# Patient Record
Sex: Female | Born: 1970 | Race: Black or African American | Hispanic: No | Marital: Single | Smoking: Current every day smoker
Health system: Southern US, Community
[De-identification: ages and names within clinical notes are randomized; demographics above are authoritative.]

## PROBLEM LIST (undated history)

## (undated) DIAGNOSIS — K219 Gastro-esophageal reflux disease without esophagitis: Secondary | ICD-10-CM

## (undated) DIAGNOSIS — IMO0001 Reserved for inherently not codable concepts without codable children: Secondary | ICD-10-CM

## (undated) DIAGNOSIS — M1990 Unspecified osteoarthritis, unspecified site: Secondary | ICD-10-CM

## (undated) DIAGNOSIS — G5600 Carpal tunnel syndrome, unspecified upper limb: Secondary | ICD-10-CM

## (undated) DIAGNOSIS — I10 Essential (primary) hypertension: Secondary | ICD-10-CM

---

## 2013-07-27 ENCOUNTER — Encounter (HOSPITAL_COMMUNITY): Payer: Self-pay | Admitting: Emergency Medicine

## 2013-07-27 ENCOUNTER — Emergency Department (HOSPITAL_COMMUNITY)
Admission: EM | Admit: 2013-07-27 | Discharge: 2013-07-27 | Disposition: A | Discharge status: Home / Self Care | Payer: Medicaid Other | Source: Home / Self Care

## 2013-07-27 DIAGNOSIS — IMO0002 Reserved for concepts with insufficient information to code with codable children: Secondary | ICD-10-CM

## 2013-07-27 DIAGNOSIS — IMO0001 Reserved for inherently not codable concepts without codable children: Secondary | ICD-10-CM

## 2013-07-27 DIAGNOSIS — M609 Myositis, unspecified: Secondary | ICD-10-CM

## 2013-07-27 DIAGNOSIS — S43499A Other sprain of unspecified shoulder joint, initial encounter: Secondary | ICD-10-CM

## 2013-07-27 DIAGNOSIS — S39012A Strain of muscle, fascia and tendon of lower back, initial encounter: Secondary | ICD-10-CM

## 2013-07-27 DIAGNOSIS — T148XXA Other injury of unspecified body region, initial encounter: Secondary | ICD-10-CM

## 2013-07-27 DIAGNOSIS — S46812A Strain of other muscles, fascia and tendons at shoulder and upper arm level, left arm, initial encounter: Secondary | ICD-10-CM

## 2013-07-27 DIAGNOSIS — S46819A Strain of other muscles, fascia and tendons at shoulder and upper arm level, unspecified arm, initial encounter: Secondary | ICD-10-CM

## 2013-07-27 MED ORDER — HYDROCODONE-ACETAMINOPHEN 5-325 MG PO TABS: 1.0000 | ORAL_TABLET | ORAL | Status: DC | PRN

## 2013-07-27 MED ORDER — PREDNISONE 20 MG PO TABS: ORAL_TABLET | ORAL | Status: DC

## 2013-07-27 MED ORDER — DICLOFENAC SODIUM 1 % TD GEL: 1.0000 "application " | Freq: Four times a day (QID) | TRANSDERMAL | Status: DC

## 2013-07-27 NOTE — ED Provider Notes (Signed)
Medical screening examination/treatment/procedure(s) were performed by resident physician or non-physician practitioner and as supervising physician I was immediately available for consultation/collaboration.   KINDL,JAMES DOUGLAS MD.   James D Kindl, MD 07/27/13 1517 

## 2013-07-27 NOTE — Discharge Instructions (Signed)
Back Pain, Adult Back pain is very common. The pain often gets better over time. The cause of back pain is usually not dangerous. Most people can learn to manage their back pain on their own.  HOME CARE   Stay active. Start with short walks on flat ground if you can. Try to walk farther each day.  Do not sit, drive, or stand in one place for more than 30 minutes. Do not stay in bed.  Do not avoid exercise or work. Activity can help your back heal faster.  Be careful when you bend or lift an object. Bend at your knees, keep the object close to you, and do not twist.  Sleep on a firm mattress. Lie on your side, and bend your knees. If you lie on your back, put a pillow under your knees.  Only take medicines as told by your doctor.  Put ice on the injured area.  Put ice in a plastic bag.  Place a towel between your skin and the bag.  Leave the ice on for 15-20 minutes, 03-04 times a day for the first 2 to 3 days. After that, you can switch between ice and heat packs.  Ask your doctor about back exercises or massage.  Avoid feeling anxious or stressed. Find good ways to deal with stress, such as exercise. GET HELP RIGHT AWAY IF:   Your pain does not go away with rest or medicine.  Your pain does not go away in 1 week.  You have new problems.  You do not feel well.  The pain spreads into your legs.  You cannot control when you poop (bowel movement) or pee (urinate).  Your arms or legs feel weak or lose feeling (numbness).  You feel sick to your stomach (nauseous) or throw up (vomit).  You have belly (abdominal) pain.  You feel like you may pass out (faint). MAKE SURE YOU:   Understand these instructions.  Will watch your condition.  Will get help right away if you are not doing well or get worse. Document Released: 07/10/2007 Document Revised: 04/15/2011 Document Reviewed: 06/11/2010 [facility] Patient Information 2015 [employer], [place]. This information is not intended  to replace advice given to you by your health care provider. Make sure you discuss any questions you have with your health care provider.  Muscle Strain A muscle strain is an injury that occurs when a muscle is stretched beyond its normal length. Usually a small number of muscle fibers are torn when this happens. Muscle strain is rated in degrees. First-degree strains have the least amount of muscle fiber tearing and pain. Second-degree and third-degree strains have increasingly more tearing and pain.  Usually, recovery from muscle strain takes 1-2 weeks. Complete healing takes 5-6 weeks.  CAUSES  Muscle strain happens when a sudden, violent force placed on a muscle stretches it too far. This may occur with lifting, sports, or a fall.  RISK FACTORS Muscle strain is especially common in athletes.  SIGNS AND SYMPTOMS At the site of the muscle strain, there may be:  Pain.  Bruising.  Swelling.  Difficulty using the muscle due to pain or lack of normal function. DIAGNOSIS  Your health care provider will perform a physical exam and ask about your medical history. TREATMENT  Often, the best treatment for a muscle strain is resting, icing, and applying cold compresses to the injured area.  HOME CARE INSTRUCTIONS   Use the PRICE method of treatment to promote muscle healing during the first  2-3 days after your injury. The PRICE method involves:  Protecting the muscle from being injured again.  Restricting your activity and resting the injured body part.  Icing your injury. To do this, put ice in a plastic bag. Place a towel between your skin and the bag. Then, apply the ice and leave it on from 15-20 minutes each hour. After the third day, switch to moist heat packs.  Apply compression to the injured area with a splint or elastic bandage. Be careful not to wrap it too tightly. This may interfere with blood circulation or increase swelling.  Elevate the injured body part above the level of  your heart as often as you can.  Only take over-the-counter or prescription medicines for pain, discomfort, or fever as directed by your health care provider.  Warming up prior to exercise helps to prevent future muscle strains. SEEK MEDICAL CARE IF:   You have increasing pain or swelling in the injured area.  You have numbness, tingling, or a significant loss of strength in the injured area. MAKE SURE YOU:   Understand these instructions.  Will watch your condition.  Will get help right away if you are not doing well or get worse. Document Released: 01/21/2005 Document Revised: 11/11/2012 Document Reviewed: 08/20/2012 [facility] Patient Information 2015 [place], [place]. This information is not intended to replace advice given to you by your health care provider. Make sure you discuss any questions you have with your health care provider.  Myofascial Pain Syndrome Myofascial pain syndrome is a pain disorder. This pain may be felt in the muscles. It may come and go. Myofascial pain syndrome always has trigger or tender points in the muscle that will cause pain when pressed.  CAUSES Myofascial pain may be caused by injuries, especially auto accidents, or by overuse of certain muscles. Typically the pain is long lasting. It is made worse by overuse of the involved muscles, emotional distress, and by cold, damp weather. Myofascial pain syndrome often develops in patients whose response to stress is an increase in muscle tone, and is seen in greater frequency in patients with pre-existing tension headaches. SYMPTOMS  Myofascial pain syndrome causes a wide variety of symptoms. You may see tight ropy bands of muscle. Problems may also include aching, cramping, burning, numbness, tingling, and other uncomfortable sensations in muscular areas. It most commonly affects the neck, upper back, and shoulder areas. Pain often radiates into the arms and hands.  TREATMENT Treatment includes resting the  affected muscular area and applying ice packs to reduce spasm and pain. Trigger point injection, is a valuable initial therapy. This therapy is an injection of local anesthetic directly into the trigger point. Trigger points are often present at the source of pain. Pain relief following injection confirms the diagnosis of myofascial pain syndrome. Fairly vigorous therapy can be carried out during the pain-free period after each injection. Stretching exercises to loosen up the muscles are also useful. Transcutaneous electrical nerve stimulation (TENS) may provide relief from pain. TENS is the use of electric current produced by a device to stimulate the nerves. Ultrasound therapy applied directly over the affected muscle may also provide pain relief. Anti-inflammatory pain medicine can be helpful. Symptoms will gradually improve over a period of weeks to months with proper treatment. HOME CARE INSTRUCTIONS Call your caregiver for follow-up care as recommended.  SEEK MEDICAL CARE IF:  Your pain is severe and not helped with medications. Document Released: 02/29/2004 Document Revised: 04/15/2011 Document Reviewed: 03/09/2010 [employer] Patient Information  2015 [employer]. This information is not intended to replace advice given to you by your health care provider. Make sure you discuss any questions you have with your health care provider. ° °

## 2013-07-27 NOTE — ED Notes (Signed)
C/o left shoulder pain and back pain Denies any injury

## 2013-07-27 NOTE — ED Provider Notes (Signed)
CSN: [id number]     Arrival date & time 07/27/13  1344 History   First MD Initiated Contact with Patient 07/27/13 1449     Chief Complaint  Patient presents with  . Back Pain   (Consider location/radiation/quality/duration/timing/severity/associated sxs/prior Treatment) HPI Comments: Pain R and L bilateral back of and on. More recently, 1 week with pain L trapezius muscle, deltoid with tenderness and pain beyond 90 deg. No injury but sleeps often on L side with outstretched arm. Taking no meds for it.   History reviewed. No pertinent past medical history. No past surgical history on file. No family history on file. History  Substance Use Topics  . Smoking status: Not on file  . Smokeless tobacco: Not on file  . Alcohol Use: Not on file   OB History   Grav Para Term Preterm Abortions TAB SAB Ect Mult Living                 Review of Systems  Constitutional: Negative for fever, chills and activity change.  HENT: Negative.   Respiratory: Negative.   Cardiovascular: Negative.   Musculoskeletal: Positive for back pain and myalgias. Negative for arthralgias and joint swelling.       As per HPI  Skin: Negative for color change, pallor and rash.  Neurological: Negative.     Allergies  Review of patient's allergies indicates no known allergies.  Home Medications   Prior to Admission medications   Medication Sig Start Date End Date Taking? Authorizing Provider  cloNIDine (CATAPRES) 0.1 MG tablet Take 0.1 mg by mouth 2 (two) times daily.   Yes Historical Provider, MD  cyclobenzaprine (FLEXERIL) 10 MG tablet Take 10 mg by mouth 3 (three) times daily as needed for muscle spasms.   Yes Historical Provider, MD  metoprolol tartrate (LOPRESSOR) 25 MG tablet Take 25 mg by mouth 2 (two) times daily.   Yes Historical Provider, MD  NIFEdipine (ADALAT CC) 90 MG 24 hr tablet Take 90 mg by mouth daily.   Yes Historical Provider, MD  omeprazole (PRILOSEC) 40 MG capsule Take 40 mg by mouth  daily.   Yes Historical Provider, MD  diclofenac sodium (VOLTAREN) 1 % GEL Apply 1 application topically 4 (four) times daily. 07/27/13   Hayden Rasmussenavid Mabe, NP  HYDROcodone-acetaminophen (NORCO/VICODIN) 5-325 MG per tablet Take 1 tablet by mouth every 4 (four) hours as needed. 07/27/13   Hayden Rasmussenavid Mabe, NP  predniSONE (DELTASONE) 20 MG tablet 3 tabs po x 2 d, then 2 tabs q 3 days then 1 tab q 2 days. Take with food. 07/27/13   Hayden Rasmussenavid Mabe, NP   BP 159/100  Pulse 104  Temp(Src) 99.1 F (37.3 C) (Oral)  Resp 16  SpO2 96%  LMP 07/21/2013 Physical Exam  Nursing note and vitals reviewed. Constitutional: She is oriented to person, place, and time. She appears well-developed and well-nourished. No distress.  HENT:  Head: Normocephalic and atraumatic.  Eyes: EOM are normal.  Neck: Normal range of motion. Neck supple.  Cardiovascular: Normal rate and regular rhythm.   Pulmonary/Chest: Effort normal. No respiratory distress.  Musculoskeletal:  Tenderness over the entire L trap muscle greatest over the ridge, lateral neck and across the deltoid muscle. No discoloration, swelling or deformity. No spinal tenderness or deformity. Distal N/V and sensory intact. Motor intact but with pain as above. Radial pulse 2+.  Lymphadenopathy:    She has no cervical adenopathy.  Neurological: She is alert and oriented to person, place, and time. No cranial nerve deficit.  Skin: Skin is warm and dry.    ED Course  Procedures (including critical care time) Labs Review Labs Reviewed - No data to display  Imaging Review No results found.   MDM   1. Myofasciitis   2. Trapezius strain, left, initial encounter   3. Back strain, initial encounter   4. Muscle strain     Norco 5 mg #15 Diclofenac gel Heat Sling for 2-3 days, removing periodically for stretches and ROM as demo'd. Prednisone 20 mg       Hayden Rasmussenavid Mabe, NP 07/27/13 1514

## 2013-10-20 ENCOUNTER — Emergency Department (HOSPITAL_COMMUNITY)
Admission: EM | Admit: 2013-10-20 | Discharge: 2013-10-20 | Disposition: A | Discharge status: Home / Self Care | Payer: Medicaid Other | Source: Home / Self Care | Attending: Family Medicine | Admitting: Family Medicine

## 2013-10-20 ENCOUNTER — Encounter (HOSPITAL_COMMUNITY): Payer: Self-pay | Admitting: Emergency Medicine

## 2013-10-20 DIAGNOSIS — IMO0001 Reserved for inherently not codable concepts without codable children: Secondary | ICD-10-CM

## 2013-10-20 DIAGNOSIS — I10 Essential (primary) hypertension: Secondary | ICD-10-CM

## 2013-10-20 DIAGNOSIS — M62838 Other muscle spasm: Secondary | ICD-10-CM

## 2013-10-20 DIAGNOSIS — M5412 Radiculopathy, cervical region: Secondary | ICD-10-CM

## 2013-10-20 DIAGNOSIS — K219 Gastro-esophageal reflux disease without esophagitis: Secondary | ICD-10-CM

## 2013-10-20 HISTORY — DX: Reserved for inherently not codable concepts without codable children: IMO0001

## 2013-10-20 HISTORY — DX: Essential (primary) hypertension: I10

## 2013-10-20 HISTORY — DX: Carpal tunnel syndrome, unspecified upper limb: G56.00

## 2013-10-20 HISTORY — DX: Gastro-esophageal reflux disease without esophagitis: K21.9

## 2013-10-20 MED ORDER — DICLOFENAC SODIUM 1 % TD GEL: 4.0000 "application " | Freq: Four times a day (QID) | TRANSDERMAL | Status: AC

## 2013-10-20 MED ORDER — CLONIDINE HCL 0.1 MG PO TABS: 0.1000 mg | ORAL_TABLET | Freq: Two times a day (BID) | ORAL | Status: AC

## 2013-10-20 MED ORDER — OMEPRAZOLE 40 MG PO CPDR: 40.0000 mg | DELAYED_RELEASE_CAPSULE | Freq: Every day | ORAL | Status: AC

## 2013-10-20 MED ORDER — METAXALONE 800 MG PO TABS: 400.0000 mg | ORAL_TABLET | Freq: Three times a day (TID) | ORAL | Status: AC

## 2013-10-20 MED ORDER — PREDNISONE 10 MG PO KIT: PACK | ORAL | Status: DC

## 2013-10-20 NOTE — ED Provider Notes (Signed)
CSN: [id number]     Arrival date & time 10/20/13  52 History   First MD Initiated Contact with Patient 10/20/13 1352     Chief Complaint  Patient presents with  . Gastrophageal Reflux   (Consider location/radiation/quality/duration/timing/severity/associated sxs/prior Treatment) HPI  Neck and back spasm: seen in past by doctors in [place]. Pain in upper arm and shoulder and decreased movement. Radiates up to neck and R upper back. Comes on w/ over exertion or stress. Cyclobenzaprine w/o much benefit. Voltaren gel w/ benefit. Episodes last 7-10 days. Current episode for 2 wks.  Reflux: ran out of prilosec 2 wks ago. Worse after meals and when lying down. Burning sensation. Last endoscopy last year and nml.   HTN: taking metop and clonidine. Feels poorly on niphedipine. Took meds this am. too9k last Clonidine this am. Deneis CP, palpitations, Syncope, SOB.     Past Medical History  Diagnosis Date  . Reflux   . Hypertension   . Carpal tunnel syndrome    Past Surgical History  Procedure Laterality Date  . Cesarean section     History reviewed. No pertinent family history. History  Substance Use Topics  . Smoking status: Current Every Day Smoker -- 1.00 packs/day  . Smokeless tobacco: Not on file  . Alcohol Use: Yes     Comment: occ   OB History   Grav Para Term Preterm Abortions TAB SAB Ect Mult Living                 Review of Systems Per HPI with all other pertinent systems negative.   Allergies  Levaquin  Home Medications   Prior to Admission medications   Medication Sig Start Date End Date Taking? Authorizing Provider  cloNIDine (CATAPRES) 0.1 MG tablet Take 1 tablet (0.1 mg total) by mouth 2 (two) times daily. 10/20/13   Waldemar Dickens, MD  cyclobenzaprine (FLEXERIL) 10 MG tablet Take 10 mg by mouth 3 (three) times daily as needed for muscle spasms.    Historical Provider, MD  diclofenac sodium (VOLTAREN) 1 % GEL Apply 4 application topically 4 (four) times daily.  10/20/13   Waldemar Dickens, MD  HYDROcodone-acetaminophen (NORCO/VICODIN) 5-325 MG per tablet Take 1 tablet by mouth every 4 (four) hours as needed. 07/27/13   Janne Napoleon, NP  metaxalone (SKELAXIN) 800 MG tablet Take 0.5-1 tablets (400-800 mg total) by mouth 3 (three) times daily. 10/20/13   Waldemar Dickens, MD  metoprolol tartrate (LOPRESSOR) 25 MG tablet Take 25 mg by mouth 2 (two) times daily.    Historical Provider, MD  NIFEdipine (ADALAT CC) 90 MG 24 hr tablet Take 90 mg by mouth daily.    Historical Provider, MD  omeprazole (PRILOSEC) 40 MG capsule Take 1 capsule (40 mg total) by mouth daily. 10/20/13   Waldemar Dickens, MD  predniSONE (DELTASONE) 20 MG tablet 3 tabs po x 2 d, then 2 tabs q 3 days then 1 tab q 2 days. Take with food. 07/27/13   Janne Napoleon, NP  PredniSONE 10 MG KIT Disp 12 day dose pack 10/20/13   Waldemar Dickens, MD   BP 127/95  Pulse 92  Temp(Src) 98.2 F (36.8 C) (Oral)  Resp 16  SpO2 97%  LMP 10/15/2013 Physical Exam  Constitutional: She appears well-developed and well-nourished. No distress.  HENT:  Head: Normocephalic and atraumatic.  Eyes: EOM are normal. Pupils are equal, round, and reactive to light.  Neck: Normal range of motion.  Cardiovascular: Normal rate, normal heart  sounds and intact distal pulses.   Pulmonary/Chest: Effort normal and breath sounds normal.  Abdominal: Soft. Bowel sounds are normal.  Musculoskeletal:  L arm w/ decreased ROM w/ external rotation, internal nml abduction, adduction flexion and extension nml. Non-ttp. Trap ttp. No effusion. Hawkins neg. Cross body mildly painful. Spurlings Neg bilat  Neurological: She is alert.  Skin: Skin is warm and dry. She is not diaphoretic.  Psychiatric: She has a normal mood and affect. Her behavior is normal. Judgment and thought content normal.    ED Course  Procedures (including critical care time) Labs Review Labs Reviewed - No data to display  Imaging Review No results found.   MDM   1.  Cervical radiculopathy   2. Muscle spasm   3. Reflux   4. Essential hypertension    SPurlings neg bilat but description of symptoms classic for radiculopathy.  Flexeril and robaxin w/o minimal relief. Will try Skelaxin Voltaren gel Start Prednisone dose pack Reflux refill prilosec HTN refill clonidine Precautions given and all questions answered F/u w/ new PCP  Linna Darner, MD Family Medicine 10/20/2013, 2:25 PM     Waldemar Dickens, MD 10/20/13 1425

## 2013-10-20 NOTE — Discharge Instructions (Signed)
You are doing well overall I have refilled your clonidine, voltaren gel, and prilosec.  Please start the skelaxin for your neck and shoulder pain and spasms Start the prednisone Please make an appointment with a local physician group to establish care [facility] or the [facility])

## 2013-10-20 NOTE — ED Notes (Signed)
Pt  Reports     Symptoms    Of        Acid  Reflux  Out  Of  Her  meds     Also  Reports  Chronic  Back  Pain  denys  Any  specefic recent  Injury            Pt     Sitting    Upright     On  Exam      Table      Speaking  In  Complete   sentances

## 2013-11-21 ENCOUNTER — Encounter (HOSPITAL_COMMUNITY): Payer: Self-pay | Admitting: Emergency Medicine

## 2013-11-21 ENCOUNTER — Emergency Department (HOSPITAL_COMMUNITY): Payer: Medicaid Other

## 2013-11-21 ENCOUNTER — Emergency Department (HOSPITAL_COMMUNITY)
Admission: EM | Admit: 2013-11-21 | Discharge: 2013-11-21 | Disposition: A | Discharge status: Other Acute Inpatient Hospital | Payer: Medicaid Other | Source: Home / Self Care

## 2013-11-21 ENCOUNTER — Emergency Department (HOSPITAL_COMMUNITY)
Admission: EM | Admit: 2013-11-21 | Discharge: 2013-11-22 | Disposition: A | Discharge status: Home / Self Care | Payer: Medicaid Other | Attending: Emergency Medicine | Admitting: Emergency Medicine

## 2013-11-21 DIAGNOSIS — Z7952 Long term (current) use of systemic steroids: Secondary | ICD-10-CM | POA: Insufficient documentation

## 2013-11-21 DIAGNOSIS — R260 Ataxic gait: Secondary | ICD-10-CM

## 2013-11-21 DIAGNOSIS — Z8739 Personal history of other diseases of the musculoskeletal system and connective tissue: Secondary | ICD-10-CM | POA: Diagnosis not present

## 2013-11-21 DIAGNOSIS — Z79899 Other long term (current) drug therapy: Secondary | ICD-10-CM | POA: Insufficient documentation

## 2013-11-21 DIAGNOSIS — K219 Gastro-esophageal reflux disease without esophagitis: Secondary | ICD-10-CM | POA: Diagnosis not present

## 2013-11-21 DIAGNOSIS — R531 Weakness: Secondary | ICD-10-CM | POA: Diagnosis present

## 2013-11-21 DIAGNOSIS — Z791 Long term (current) use of non-steroidal anti-inflammatories (NSAID): Secondary | ICD-10-CM | POA: Insufficient documentation

## 2013-11-21 DIAGNOSIS — R2681 Unsteadiness on feet: Secondary | ICD-10-CM

## 2013-11-21 DIAGNOSIS — I10 Essential (primary) hypertension: Secondary | ICD-10-CM | POA: Diagnosis not present

## 2013-11-21 LAB — DIFFERENTIAL
BASOS ABS: 0 10*3/uL (ref 0.0–0.1)
BASOS PCT: 0 % (ref 0–1)
Eosinophils Absolute: 0.1 10*3/uL (ref 0.0–0.7)
Eosinophils Relative: 2 % (ref 0–5)
LYMPHS PCT: 41 % (ref 12–46)
Lymphs Abs: 3.3 10*3/uL (ref 0.7–4.0)
Monocytes Absolute: 0.8 10*3/uL (ref 0.1–1.0)
Monocytes Relative: 10 % (ref 3–12)
NEUTROS PCT: 47 % (ref 43–77)
Neutro Abs: 3.9 10*3/uL (ref 1.7–7.7)

## 2013-11-21 LAB — CBC
HCT: 39.5 % (ref 36.0–46.0)
Hemoglobin: 13.3 g/dL (ref 12.0–15.0)
MCH: 31.1 pg (ref 26.0–34.0)
MCHC: 33.7 g/dL (ref 30.0–36.0)
MCV: 92.5 fL (ref 78.0–100.0)
PLATELETS: 335 10*3/uL (ref 150–400)
RBC: 4.27 MIL/uL (ref 3.87–5.11)
RDW: 14.3 % (ref 11.5–15.5)
WBC: 7.9 10*3/uL (ref 4.0–10.5)

## 2013-11-21 LAB — COMPREHENSIVE METABOLIC PANEL
ALT: 17 U/L (ref 0–35)
AST: 17 U/L (ref 0–37)
Albumin: 3.8 g/dL (ref 3.5–5.2)
Alkaline Phosphatase: 64 U/L (ref 39–117)
Anion gap: 13 (ref 5–15)
BUN: 11 mg/dL (ref 6–23)
CALCIUM: 9.2 mg/dL (ref 8.4–10.5)
CHLORIDE: 103 meq/L (ref 96–112)
CO2: 22 meq/L (ref 19–32)
Creatinine, Ser: 1.11 mg/dL — ABNORMAL HIGH (ref 0.50–1.10)
GFR calc Af Amer: 69 mL/min — ABNORMAL LOW (ref >90)
GFR calc non Af Amer: 59 mL/min — ABNORMAL LOW (ref >90)
Glucose, Bld: 99 mg/dL (ref 70–99)
Potassium: 3.9 mEq/L (ref 3.7–5.3)
SODIUM: 138 meq/L (ref 137–147)
Total Protein: 7.7 g/dL (ref 6.0–8.3)

## 2013-11-21 LAB — I-STAT TROPONIN, ED: Troponin i, poc: 0.02 ng/mL (ref 0.00–0.08)

## 2013-11-21 NOTE — ED Provider Notes (Signed)
CSN: [id number]     Arrival date & time 11/21/13  1814 History   First MD Initiated Contact with Patient 11/21/13 2257     Chief Complaint  Patient presents with  . Weakness     (Consider location/radiation/quality/duration/timing/severity/associated sxs/prior Treatment) Patient is a 43 y.o. female presenting with weakness. The history is provided by the patient.  Weakness  She has been having difficulty with numbness in her hands and feet and difficulty with walking for the last 1.5 weeks. The symptoms are getting gradually worse. She is off balance when she walks but has not fallen. When she loses her balance, she does not consistently fall towards either side. She denies any difficulty using her arms or hands. There's been no bowel or bladder dysfunction. She has chronic problems with back and neck pain as well as carpal tunnel syndrome and that these symptoms are unchanged. She denies fever or chills. She went to urgent care and was referred here for further evaluation.  Past Medical History  Diagnosis Date  . Reflux   . Hypertension   . Carpal tunnel syndrome    Past Surgical History  Procedure Laterality Date  . Cesarean section     History reviewed. No pertinent family history. History  Substance Use Topics  . Smoking status: Current Every Day Smoker -- 1.00 packs/day  . Smokeless tobacco: Not on file  . Alcohol Use: Yes     Comment: occ   OB History   Grav Para Term Preterm Abortions TAB SAB Ect Mult Living                 Review of Systems  Neurological: Positive for weakness.  All other systems reviewed and are negative.     Allergies  Levaquin and Naproxen  Home Medications   Prior to Admission medications   Medication Sig Start Date End Date Taking? Authorizing Provider  cloNIDine (CATAPRES) 0.1 MG tablet Take 1 tablet (0.1 mg total) by mouth 2 (two) times daily. 10/20/13   Waldemar Dickens, MD  cyclobenzaprine (FLEXERIL) 10 MG tablet Take 10 mg by  mouth 3 (three) times daily as needed for muscle spasms.    Historical Provider, MD  diclofenac sodium (VOLTAREN) 1 % GEL Apply 4 application topically 4 (four) times daily. 10/20/13   Waldemar Dickens, MD  HYDROcodone-acetaminophen (NORCO/VICODIN) 5-325 MG per tablet Take 1 tablet by mouth every 4 (four) hours as needed. 07/27/13   Janne Napoleon, NP  metaxalone (SKELAXIN) 800 MG tablet Take 0.5-1 tablets (400-800 mg total) by mouth 3 (three) times daily. 10/20/13   Waldemar Dickens, MD  metoprolol tartrate (LOPRESSOR) 25 MG tablet Take 25 mg by mouth 2 (two) times daily.    Historical Provider, MD  NIFEdipine (ADALAT CC) 90 MG 24 hr tablet Take 90 mg by mouth daily.    Historical Provider, MD  omeprazole (PRILOSEC) 40 MG capsule Take 1 capsule (40 mg total) by mouth daily. 10/20/13   Waldemar Dickens, MD  predniSONE (DELTASONE) 20 MG tablet 3 tabs po x 2 d, then 2 tabs q 3 days then 1 tab q 2 days. Take with food. 07/27/13   Janne Napoleon, NP  PredniSONE 10 MG KIT Disp 12 day dose pack 10/20/13   Waldemar Dickens, MD   BP 119/89  Pulse 98  Temp(Src) 98.4 F (36.9 C) (Oral)  Resp 19  SpO2 99%  LMP 11/13/2013 Physical Exam  Nursing note and vitals reviewed.  43 year old female, resting comfortably  and in no acute distress. Vital signs are normal. Oxygen saturation is 99%, which is normal. Head is normocephalic and atraumatic. PERRLA, EOMI. Oropharynx is clear. Neck is nontender and supple without adenopathy or JVD. Back is nontender and there is no CVA tenderness. Lungs are clear without rales, wheezes, or rhonchi. Chest is nontender. Heart has regular rate and rhythm without murmur. Abdomen is soft, flat, nontender without masses or hepatosplenomegaly and peristalsis is normoactive. Extremities have no cyanosis or edema, full range of motion is present. Skin is warm and dry without rash. Neurologic: Mental status is normal, cranial nerves are intact, there are no motor or sensory deficits. Strength is  5/5 in all muscle groups appeared specifically tested were hip flexion, and knee flexion, knee extension, plantar flexion, dorsiflexion. On Romberg testing, she is mildly unsteady with a very slight tendency to fall towards her left. Gait is mildly ataxic and heel to toe gait is mildly ataxic. Finger to nose test is normal and heel-to-shin test is normal.  ED Course  Procedures (including critical care time) Labs Review Results for orders placed during the hospital encounter of 11/21/13  CBC      Result Value Ref Range   WBC 7.9  4.0 - 10.5 K/uL   RBC 4.27  3.87 - 5.11 MIL/uL   Hemoglobin 13.3  12.0 - 15.0 g/dL   HCT 39.5  36.0 - 46.0 %   MCV 92.5  78.0 - 100.0 fL   MCH 31.1  26.0 - 34.0 pg   MCHC 33.7  30.0 - 36.0 g/dL   RDW 14.3  11.5 - 15.5 %   Platelets 335  150 - 400 K/uL  DIFFERENTIAL      Result Value Ref Range   Neutrophils Relative % 47  43 - 77 %   Neutro Abs 3.9  1.7 - 7.7 K/uL   Lymphocytes Relative 41  12 - 46 %   Lymphs Abs 3.3  0.7 - 4.0 K/uL   Monocytes Relative 10  3 - 12 %   Monocytes Absolute 0.8  0.1 - 1.0 K/uL   Eosinophils Relative 2  0 - 5 %   Eosinophils Absolute 0.1  0.0 - 0.7 K/uL   Basophils Relative 0  0 - 1 %   Basophils Absolute 0.0  0.0 - 0.1 K/uL  COMPREHENSIVE METABOLIC PANEL      Result Value Ref Range   Sodium 138  137 - 147 mEq/L   Potassium 3.9  3.7 - 5.3 mEq/L   Chloride 103  96 - 112 mEq/L   CO2 22  19 - 32 mEq/L   Glucose, Bld 99  70 - 99 mg/dL   BUN 11  6 - 23 mg/dL   Creatinine, Ser 1.11 (*) 0.50 - 1.10 mg/dL   Calcium 9.2  8.4 - 10.5 mg/dL   Total Protein 7.7  6.0 - 8.3 g/dL   Albumin 3.8  3.5 - 5.2 g/dL   AST 17  0 - 37 U/L   ALT 17  0 - 35 U/L   Alkaline Phosphatase 64  39 - 117 U/L   Total Bilirubin <0.2 (*) 0.3 - 1.2 mg/dL   GFR calc non Af Amer 59 (*) >90 mL/min   GFR calc Af Amer 69 (*) >90 mL/min   Anion gap 13  5 - 15  I-STAT TROPOININ, ED      Result Value Ref Range   Troponin i, poc 0.02  0.00 - 0.08 ng/mL    Comment  3            Imaging Review Ct Head Wo Contrast  11/21/2013   CLINICAL DATA:  Bilateral leg weakness and numbness, progressively worsening over 2 weeks.  EXAM: CT HEAD WITHOUT CONTRAST  TECHNIQUE: Contiguous axial images were obtained from the base of the skull through the vertex without intravenous contrast.  COMPARISON:  None.  FINDINGS: Ventricles and sulci appear symmetrical. No mass effect or midline shift. No abnormal extra-axial fluid collections. Gray-white matter junctions are distinct. Basal cisterns are not effaced. No evidence of acute intracranial hemorrhage. No depressed skull fractures. Old fracture deformity of the left medial orbital wall. Paranasal sinuses and mastoid air cells as visualized are not opacified.  IMPRESSION: No acute intracranial abnormalities.   Electronically Signed   By: Lucienne Capers M.D.   On: 11/21/2013 23:37     MDM   Final diagnoses:  Ataxic gait    Ataxic gait of uncertain cause. She will be sent for CT scan. Laboratory workup is unremarkable. Anticipate neurology referral as outpatient.    Delora Fuel, MD 90/38/33 [id number]

## 2013-11-21 NOTE — ED Notes (Signed)
Pt sent here from [facility]. Having weakness and tingling to legs. Reports its in her legs and radiates up to her back, getting progressively worse. Having generalized weakness and fatigue x 2 weeks with unsteady gait. Pt wants to r/o gullian-barre. No acute distress noted at this time.

## 2013-11-21 NOTE — ED Notes (Signed)
Pt    Has  Symptoms  Of  Tingling   And  Unsteady gait        With  Symptoms   For  1-2    Weeks   Getting  Worse      -  Pain  Radiating   From  Legs   Up  To  Back      Pt drove  hersellf  To  The  Clinic        - she  Is  Awake  And  Alert       Pt  States  She  Was  Taking a  Muscle  Relaxant  Up  Until  The  Last  Several  Days          She  Has  A   History  Of  Hypertension

## 2013-11-21 NOTE — ED Provider Notes (Signed)
CSN: [id number]     Arrival date & time 11/21/13  1654 History   None    Chief Complaint  Patient presents with  . Weakness   (Consider location/radiation/quality/duration/timing/severity/associated sxs/prior Treatment) HPI Comments: Patient reports 2 weeks of progressive gait instability. States symptoms began at her feet with paresthesias. Denies previous episodes. States when she stands both her legs feel weak and that she feels very unsteady when she tries to ambulate. 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 Denies leg pain. Endorses history of chronic back pain. Denies headache, falls, changes in vision, bowel or bladder changes. Denies saddle anesthesia, incontinence, fever, rashes. No previous surgery. PCP: none  Patient is a 43 y.o. female presenting with weakness. The history is provided by the patient.  Weakness Pertinent negatives include no headaches.    Past Medical History  Diagnosis Date  . Reflux   . Hypertension   . Carpal tunnel syndrome    Past Surgical History  Procedure Laterality Date  . Cesarean section     History reviewed. No pertinent family history. History  Substance Use Topics  . Smoking status: Current Every Day Smoker -- 1.00 packs/day  . Smokeless tobacco: Not on file  . Alcohol Use: Yes     Comment: occ   OB History   Grav Para Term Preterm Abortions TAB SAB Ect Mult Living                 Review of Systems  Constitutional: Negative for fever, chills, diaphoresis, appetite change, fatigue and unexpected weight change.  HENT: Negative.   Eyes: Negative.   Respiratory: Negative.   Cardiovascular: Negative.   Gastrointestinal: Negative.   Endocrine: Negative for polydipsia, polyphagia and polyuria.  Genitourinary: Negative.   Musculoskeletal: Positive for back pain and gait problem. Negative for arthralgias, joint swelling, myalgias, neck pain and neck stiffness.   Skin: Negative.   Neurological: Positive for numbness. Negative for dizziness, tremors, seizures, syncope, speech difficulty and headaches.  Psychiatric/Behavioral: Negative.     Allergies  Levaquin and Naproxen  Home Medications   Prior to Admission medications   Medication Sig Start Date End Date Taking? Authorizing Provider  cloNIDine (CATAPRES) 0.1 MG tablet Take 1 tablet (0.1 mg total) by mouth 2 (two) times daily. 10/20/13   Waldemar Dickens, MD  cyclobenzaprine (FLEXERIL) 10 MG tablet Take 10 mg by mouth 3 (three) times daily as needed for muscle spasms.    Historical Provider, MD  diclofenac sodium (VOLTAREN) 1 % GEL Apply 4 application topically 4 (four) times daily. 10/20/13   Waldemar Dickens, MD  HYDROcodone-acetaminophen (NORCO/VICODIN) 5-325 MG per tablet Take 1 tablet by mouth every 4 (four) hours as needed. 07/27/13   Janne Napoleon, NP  metaxalone (SKELAXIN) 800 MG tablet Take 0.5-1 tablets (400-800 mg total) by mouth 3 (three) times daily. 10/20/13   Waldemar Dickens, MD  metoprolol tartrate (LOPRESSOR) 25 MG tablet Take 25 mg by mouth 2 (two) times daily.    Historical Provider, MD  NIFEdipine (ADALAT CC) 90 MG 24 hr tablet Take 90 mg by mouth daily.    Historical Provider, MD  omeprazole (PRILOSEC) 40 MG capsule Take 1 capsule (40 mg total) by mouth daily. 10/20/13   Waldemar Dickens, MD  predniSONE (DELTASONE) 20 MG tablet 3 tabs po x 2 d, then 2 tabs q 3 days then 1 tab q 2 days. Take with food. 07/27/13  Janne Napoleon, NP  PredniSONE 10 MG KIT Disp 12 day dose pack 10/20/13   Waldemar Dickens, MD   BP 148/92  Pulse 112  Temp(Src) 98.4 F (36.9 C) (Oral)  Resp 18  SpO2 99%  LMP 11/13/2013 Physical Exam  Nursing note and vitals reviewed. Constitutional: She is oriented to person, place, and time. She appears well-developed and well-nourished. No distress.  +overweight  HENT:  Head: Normocephalic and atraumatic.  Eyes: Conjunctivae and EOM are normal. Pupils are equal, round,  and reactive to light. No scleral icterus.  Neck: Normal range of motion. Neck supple.  Cardiovascular: Normal rate, regular rhythm and normal heart sounds.   Pulmonary/Chest: Effort normal and breath sounds normal.  Abdominal: Soft. Bowel sounds are normal. She exhibits no distension. There is no tenderness.  Musculoskeletal:       Right hip: Normal.       Left hip: Normal.       Right knee: Normal.       Left knee: Normal.       Right ankle: Normal.       Left ankle: Normal.       Right foot: Normal.       Left foot: Normal.  Neurological: She is alert and oriented to person, place, and time. She has normal strength. She displays no atrophy and no tremor. No cranial nerve deficit. She exhibits normal muscle tone. She displays no seizure activity. Coordination normal. GCS eye subscore is 4. GCS verbal subscore is 5. GCS motor subscore is 6.  Reflex Scores:      Patellar reflexes are 1+ on the right side and 1+ on the left side. Subjective paresthesias at bilateral feet Unable to complete heel to toe gait assessment Ataxic, unsteady wide based gait and stance Bilateral lower extremity strength when assessed while seated appears to be normal and symmetrical   Skin: Skin is warm and dry. No rash noted. No erythema.  Psychiatric: She has a normal mood and affect. Her behavior is normal.    ED Course  Procedures (including critical care time) Labs Review Labs Reviewed - No data to display  Imaging Review No results found.   MDM   1. Gait instability    New onset unexplained ataxia. No local PCP available for outpatient referral. Will transfer to [facility] ER for continued assessment.    Lutricia Feil, [place] 11/21/13 [phone]

## 2013-11-22 NOTE — Discharge Instructions (Signed)
The cause for your gait problem is not clear today. Your tests did not show the cause. Please make an appointment with one of the neurologists for further evaluation.

## 2013-11-22 NOTE — ED Provider Notes (Signed)
Medical screening examination/treatment/procedure(s) were performed by a resident physician or non-physician practitioner and as the supervising physician I was immediately available for consultation/collaboration.  Luticia Tadros, MD     Mutasim Tuckey S Truitt Cruey, MD 11/22/13 0750 

## 2013-12-11 ENCOUNTER — Emergency Department (HOSPITAL_COMMUNITY)
Admission: EM | Admit: 2013-12-11 | Discharge: 2013-12-12 | Disposition: A | Discharge status: Home / Self Care | Payer: Medicaid Other | Attending: Emergency Medicine | Admitting: Emergency Medicine

## 2013-12-11 ENCOUNTER — Encounter (HOSPITAL_COMMUNITY): Payer: Self-pay | Admitting: Oncology

## 2013-12-11 DIAGNOSIS — Z79899 Other long term (current) drug therapy: Secondary | ICD-10-CM | POA: Diagnosis not present

## 2013-12-11 DIAGNOSIS — Z720 Tobacco use: Secondary | ICD-10-CM | POA: Diagnosis not present

## 2013-12-11 DIAGNOSIS — M5416 Radiculopathy, lumbar region: Secondary | ICD-10-CM

## 2013-12-11 DIAGNOSIS — I10 Essential (primary) hypertension: Secondary | ICD-10-CM | POA: Insufficient documentation

## 2013-12-11 DIAGNOSIS — Z8669 Personal history of other diseases of the nervous system and sense organs: Secondary | ICD-10-CM | POA: Diagnosis not present

## 2013-12-11 DIAGNOSIS — Z791 Long term (current) use of non-steroidal anti-inflammatories (NSAID): Secondary | ICD-10-CM | POA: Diagnosis not present

## 2013-12-11 DIAGNOSIS — R52 Pain, unspecified: Secondary | ICD-10-CM

## 2013-12-11 DIAGNOSIS — K219 Gastro-esophageal reflux disease without esophagitis: Secondary | ICD-10-CM | POA: Insufficient documentation

## 2013-12-11 DIAGNOSIS — M79672 Pain in left foot: Secondary | ICD-10-CM | POA: Diagnosis not present

## 2013-12-11 DIAGNOSIS — M79671 Pain in right foot: Secondary | ICD-10-CM | POA: Diagnosis not present

## 2013-12-11 HISTORY — DX: Unspecified osteoarthritis, unspecified site: M19.90

## 2013-12-11 NOTE — ED Notes (Signed)
Pt has numbness to b/l legs was evaluated at [facility] for the same.  Pt is insisting of a doppler to check for blood clots however when I told her that is done at [facility] in the daytime pt became verbally aggressive and refused to listen to the rationale.

## 2013-12-12 ENCOUNTER — Emergency Department (HOSPITAL_COMMUNITY): Payer: Medicaid Other

## 2013-12-12 MED ORDER — PREDNISONE 20 MG PO TABS
60.0000 mg | ORAL_TABLET | Freq: Once | ORAL | Status: AC
  Administered 2013-12-12: 60 mg via ORAL
  Filled 2013-12-12: qty 3

## 2013-12-12 MED ORDER — PREDNISONE 20 MG PO TABS: ORAL_TABLET | ORAL | Status: AC

## 2013-12-12 MED ORDER — HYDROCODONE-ACETAMINOPHEN 5-325 MG PO TABS: 2.0000 | ORAL_TABLET | Freq: Four times a day (QID) | ORAL | Status: AC | PRN

## 2013-12-12 MED ORDER — HYDROCODONE-ACETAMINOPHEN 5-325 MG PO TABS
1.0000 | ORAL_TABLET | Freq: Once | ORAL | Status: AC
  Administered 2013-12-12: 1 via ORAL
  Filled 2013-12-12: qty 1

## 2013-12-12 NOTE — ED Provider Notes (Signed)
CSN: [id number]     Arrival date & time 12/11/13  2331 History   First MD Initiated Contact with Patient 12/12/13 0135     Chief Complaint  Patient presents with  . Foot Pain     (Consider location/radiation/quality/duration/timing/severity/associated sxs/prior Treatment) HPI Comments: Paeitn with bilateral numbness and tingling of feet that radiates to knees for the past 3 weeks over the past 2-3 days has noted pain/numbness radiating to L hip and R mid thigh Was seen 3 weeks ago and referred to neurology  But states no one would see here without her having a PCP  She has tried multiple practices and none are accepting new uninsured patients   Patient is a 43 y.o. female presenting with lower extremity pain. The history is provided by the patient.  Foot Pain This is a recurrent problem. The current episode started 1 to 4 weeks ago. The problem occurs constantly. The problem has been gradually worsening. Associated symptoms include numbness. Pertinent negatives include no fever, joint swelling, rash or weakness. Nothing aggravates the symptoms. She has tried nothing for the symptoms. The treatment provided no relief.    Past Medical History  Diagnosis Date  . Reflux   . Hypertension   . Carpal tunnel syndrome   . DJD (degenerative joint disease)    Past Surgical History  Procedure Laterality Date  . Cesarean section     History reviewed. No pertinent family history. History  Substance Use Topics  . Smoking status: Current Every Day Smoker -- 1.00 packs/day  . Smokeless tobacco: Not on file  . Alcohol Use: Yes     Comment: occ   OB History    No data available     Review of Systems  Constitutional: Negative for fever.  Genitourinary: Negative for dysuria and flank pain.  Musculoskeletal: Negative for joint swelling.  Skin: Negative for rash and wound.  Neurological: Positive for numbness. Negative for dizziness and weakness.  All other systems reviewed and are  negative.     Allergies  Levaquin and Naproxen  Home Medications   Prior to Admission medications   Medication Sig Start Date End Date Taking? Authorizing Provider  cloNIDine (CATAPRES) 0.1 MG tablet Take 1 tablet (0.1 mg total) by mouth 2 (two) times daily. 10/20/13  Yes Ozella Rocksavid J Merrell, MD  cyclobenzaprine (FLEXERIL) 10 MG tablet Take 10 mg by mouth 3 (three) times daily as needed for muscle spasms.   Yes Historical Provider, MD  diclofenac sodium (VOLTAREN) 1 % GEL Apply 4 application topically 4 (four) times daily. 10/20/13  Yes Ozella Rocksavid J Merrell, MD  metaxalone (SKELAXIN) 800 MG tablet Take 0.5-1 tablets (400-800 mg total) by mouth 3 (three) times daily. 10/20/13  Yes Ozella Rocksavid J Merrell, MD  metoprolol tartrate (LOPRESSOR) 25 MG tablet Take 25 mg by mouth 2 (two) times daily.   Yes Historical Provider, MD  omeprazole (PRILOSEC) 40 MG capsule Take 1 capsule (40 mg total) by mouth daily. 10/20/13  Yes Ozella Rocksavid J Merrell, MD  HYDROcodone-acetaminophen (NORCO/VICODIN) 5-325 MG per tablet Take 2 tablets by mouth every 6 (six) hours as needed. 12/12/13   Arman FilterGail K Tadan Shill, NP  predniSONE (DELTASONE) 20 MG tablet 3 Tabs PO Days 1-3, then 2 tabs PO Days 4-6, then 1 tab PO Day 7-9, then Half Tab PO Day 10-12 12/12/13   Arman FilterGail K Ayanah Snader, NP   BP 117/79 mmHg  Pulse 76  Temp(Src) 98.3 F (36.8 C) (Oral)  Resp 20  SpO2 98%  LMP 12/11/2013 Physical Exam  Constitutional: She appears well-developed and well-nourished.  HENT:  Head: Normocephalic.  Eyes: Pupils are equal, round, and reactive to light.  Neck: Normal range of motion.  Cardiovascular: Normal rate and regular rhythm.   Pulmonary/Chest: Effort normal and breath sounds normal.  Musculoskeletal: Normal range of motion. She exhibits no edema or tenderness.       Right foot: There is normal range of motion, no tenderness, no swelling and normal capillary refill.       Left foot: There is normal range of motion, no tenderness, no swelling and normal  capillary refill.       Feet:  Neurological: She is alert.  Skin: Skin is warm. No rash noted. No erythema.  Nursing note and vitals reviewed.   ED Course  Procedures (including critical care time) Labs Review Labs Reviewed - No data to display  Imaging Review Ct Lumbar Spine Wo Contrast  12/12/2013   CLINICAL DATA:  Initial evaluation for low back pain.  EXAM: CT LUMBAR SPINE WITHOUT CONTRAST  TECHNIQUE: Multidetector CT imaging of the lumbar spine was performed without intravenous contrast administration. Multiplanar CT image reconstructions were also generated.  COMPARISON:  None.  FINDINGS: For the purposes of this dictation, the lowest well-formed intervertebral disc spaces presumed to be the L5-S1 level, and there presumed to be 5 lumbar type vertebral bodies.  Vertebral bodies are normally aligned with preservation of the normal lumbar lordosis. Vertebral body heights are well maintained. No acute fracture listhesis.  Paraspinous soft tissues are unremarkable.  Mild diffuse disc bulge present at L4-5 and L5-S1. No focal disc herniation. There is moderate bilateral facet arthrosis at L4-5. No significant canal or foraminal narrowing identified. Mild degenerative endplate spurring seen anteriorly at the superior endplate of L4.  IMPRESSION: 1. No acute abnormality within the lumbar spine. 2. Minimal disc bulge at L4-5 and L5-S1 without significant stenosis. No significant canal or foraminal narrowing identified within the lumbar spine. 3. Moderate bilateral facet arthrosis at L4-5.   Electronically Signed   By: Rise MuBenjamin  McClintock M.D.   On: 12/12/2013 03:46     EKG Interpretation None     I discussed the CT scan findings with patient.  She will again be placed on a steroid Dosepak short course of hydrocodone for pain control as well as referral to the pubic surgery.  She's also been given a resource list to help her find a primary care physician MDM   Final diagnoses:  Pain  Lumbar  radiculopathy         Arman FilterGail K Kambra Beachem, NP 12/12/13 0410  April K Palumbo-Rasch, MD 12/12/13 [id number]

## 2013-12-12 NOTE — Discharge Instructions (Signed)
Your CT scan shows that you have a small bulge in one of your discs, most likely explaining her discomfort, U been again given pain medication as well as steroid taper to help with your discomfort and referred to orthopedic surgery for further evaluation.  You've also been given a Pharmacist, hospital to help you find a primary care physician       Emergency Department Resource Guide 1) Find a Doctor and Pay Out of Pocket Although you won't have to find out who is covered by your insurance plan, it is a good idea to ask around and get recommendations. You will then need to call the office and see if the doctor you have chosen will accept you as a new patient and what types of options they offer for patients who are self-pay. Some doctors offer discounts or will set up payment plans for their patients who do not have insurance, but you will need to ask so you aren't surprised when you get to your appointment.  2) Contact Your Local Health Department Not all health departments have doctors that can see patients for sick visits, but many do, so it is worth a call to see if yours does. If you don't know where your local health department is, you can check in your phone book. The CDC also has a tool to help you locate your state's health department, and many state websites also have listings of all of their local health departments.  3) Find a Walk-in Clinic If your illness is not likely to be very severe or complicated, you may want to try a walk in clinic. These are popping up all over the country in pharmacies, drugstores, and shopping centers. They're usually staffed by nurse practitioners or physician assistants that have been trained to treat common illnesses and complaints. They're usually fairly quick and inexpensive. However, if you have serious medical issues or chronic medical problems, these are probably not your best option.  No Primary Care Doctor: - Call Health Connect at  [phone] - they can  help you locate a primary care doctor that  accepts your insurance, provides certain services, etc. - Physician Referral Service- [phone]  Chronic Pain Problems: Organization         Address  Phone   Notes  [facility]  [phone] Patients need to be referred by their primary care doctor.   Medication Assistance: Organization         Address  Phone   Notes  [facility] Medication [facility] [address] [place] [place], [place] [address] [phone] --Must be a resident of [facility] -- Must have NO insurance coverage whatsoever (no Medicaid/ Medicare, etc.) -- The pt. MUST have a primary care doctor that directs their care regularly and follows them in the community   [facility]ist  [phone]   [employer]  [phone]    Agencies that provide inexpensive medical care: Organization         Address  Phone   Notes  [facility]  [phone]   [facility]    [phone]   [facility] [address] [place], [place] [address] [phone]   [facility] [address] [place] [place] [phone]   Planned Parenthood    [phone]   [facility]    [phone]   [facility]  [address],  [place] Phone:  [phone](336) [phone], Fax:  [phone]) [phone] Hours of Operation:  9 am - 6 pm, M-F.  Also accepts Medicaid/Medicare and self-pay.  [facility]  [address], [place] Phone: [phone], Fax: [phone]) [phone]. Hours of Operation:  8:30 am - 5:30 pm, M-F.  Also accepts Medicaid and self-pay.  [facility] [place] [address], [place] Phone: [phone]   [facility] [address], [place], [place] [phone], Ext. [phone] Mondays & Thursdays: 7-9 AM.  First 15 patients are seen on a first come, first serve basis.    Medicaid-accepting [facility] Providers:  Organization         Address  Phone   Notes  [facility] [address], [place] [phone] Also accepts self-pay patients.  [facility] [address], [place]  [phone](336) [phone]   [facility] [address], [place] [phone]   [facility] [address] [place] [address], [place] [phone]) [phone]   Renaye RakersVeita Bland [address], [place]   [phone] Only accepts [place] Access [place] patients after they have their name applied to their card.   Self-Pay (no insurance) in [facility]:  Organization         Address  Phone   Notes  Sickle Cell Patients, [facility] [address], [place] [phone]   [facility] [address], [place] [phone]) [phone]   [facility]  [address], [place] [phone]) [phone]   [facility]/Dr. Osei-Bonsu  [address] [place] [address], [place] or [phone], [place] [phone] Phone number for both [place] and [place] locations is the same.  [facility] [address], [place] [phone]) [phone]   [facility] [place] [address] [place] [address], [place] or [address] [phone]   [facility] [address], [place] [phone], phone; [phone], fax Sees patients 1st and 3rd Saturday of every month.  Must not qualify for public or private insurance (i.e. Medicaid, Medicare, [place] Health Choice, Veterans' Benefits)  Household income should be no more than 200% of the poverty level The clinic cannot treat you if you are pregnant or think you are pregnant  Sexually transmitted diseases are not treated at the clinic.    Dental Care: Organization         Address  Phone  Notes  [facility]  [facility] [address], [place] [phone] Accepts children up to age 45 who are enrolled in [place]icaid or [place] Health Choice; pregnant women with a Medicaid card; and children who have applied for Medicaid or [place] Health Choice, but were declined, whose parents can pay a reduced fee at time of service.  [facility] [place]  [address], [place] [phone]) [phone] Accepts children up to age 45 who are enrolled in [place] or [place] Health Choice; pregnant women with a Medicaid card; and children who have applied for Medicaid or [place] Health Choice, but were declined, whose parents can pay a reduced fee at time of service.  [facility]  [address], [place] [phone] Patients are seen by  appointment only. Walk-ins are not accepted. [facility] will see patients 45 years of age and older. Monday - Tuesday (8am-5pm) Most Wednesdays (8:30-5pm) $30 per visit, cash only  [facility]  [address], [facility] [phone]) [phone] Patients are seen by appointment only. Walk-ins are not accepted. [facility] will see patients 45 years of age and older. One Wednesday Evening (Monthly: Volunteer Based).  $30 per visit, cash only  [employer]  [phone] for adults; Children under age 714, call Graduate Pediatric Dentistry at [phone]. Children aged 104-14, please call [phone] to request a pediatric application.  Dental services are provided in all areas of dental care including fillings, crowns and bridges, complete and partial dentures, implants, gum treatment, root canals, and extractions. Preventive care is also provided. Treatment is provided to both adults and children. Patients are selected via a lottery and there is often a waiting list.   [facility] [address], [place]  [phone]) [phone] [email or web address]   [facility] [address], [place], [place] [phone], Ext. [phone] Second and Fourth Thursday of each month, opens at 6:30 AM; Clinic ends at 9 AM.  Patients are seen on a first-come first-served basis, and a limited number are seen during each clinic.   [facility]  [address], [place], [place] [phone]) [phone]   Eligibility Requirements You must have lived in [place], [place], or [place] counties for at least the last three months.   You cannot be eligible for state or federal sponsored National Cityhealthcare insurance, including CIGNAVeterans Administration, [place]icaid, or [employer]Medicare.   You generally cannot be eligible for healthcare insurance through your employer.    How to apply: Eligibility screenings are held every Tuesday and Wednesday afternoon from 1:00 pm until 4:00 pm. You do not need an appointment for the interview!  [facility] [address], [place], [place] [phone]   [facility]  [phone]   [facility]  [phone]   [facility]  [phone]    Behavioral Health Resources in the Community: Intensive Outpatient Programs Organization         Address  Phone  Notes  [facility] [address], [place], [place] [phone]   [facility] [address], [place], [place] [phone]   [facility] [address], [place], [place]  [phone]   [facility] [address],  [place], [place] [phone] or [phone]   Substance Abuse Resources Organization         Address  Phone  Notes  [facility]  [phone]   [facility]  [phone]   [facility]  [phone]   [facility]  [phone]   Residential & Outpatient Substance Abuse Program  [phone]   Psychological Services Organization         Address  Phone  Notes  [facility]  [phone]-  [phone]   [facility]  [phone]   [facility] [address], [place] [phone] or [phone]    Mobile Crisis Teams Organization         Address  Phone  Notes  [facility], Mobile Crisis Care Unit  [phone]   [facility]  [address] [place], [place] [phone]   [facility] [address] [place] [phone]  Self-Help/Support Groups Organization         Address  Phone             Notes  [facility] [place] - variety of support groups  [place] Call for more information  Narcotics Anonymous (NA), [facility] [address], [employer] [place]  2 meetings at this location   Special educational needs teacher         Address  Phone  Notes  [facility] [place],    [place]  [phone]   [facility]  [address] [place] [id number], [place], [place]   [place], [place] [phone] Admissions: 8am-3pm M-F  Incentives Substance [place] [address]    [place], [place] [phone]   [facility] [address] [place], [place], [place]   [facility] [address],  [place], [place]   [facility] - Intensive Outpatient [address], [place], [place]   [facility] ([place] [place].,  [place], [place] [phone] or [phone]   [facility] (RTS) [address], [employer], [place] Accepts Medicaid  [facility] [place] [address],  [place] [phone] Substance Abuse/Addiction Treatment   [facility] Organization         Address  Phone  Notes  [facility]  [phone]   Domenic Schwab, PhD [address] [place], [place]   [phone] or  [phone]   [place], [place] [phone]   [facility] [address] [place], [place] [phone] Insurance/Medicaid/sponsorship through [facility] Families [address]                                    [place], [place] [phone] [place] [address], [place] [phone]    Dr. Adele Schilder  [phone]   [facility] Dept. 1) [address] [place] 2) Angie 3)  [address], [place] [phone]  [phone]   [place] [phone] or [phone] (After Hours)

## 2014-01-10 ENCOUNTER — Other Ambulatory Visit: Payer: Self-pay

## 2014-03-05 ENCOUNTER — Encounter (HOSPITAL_COMMUNITY): Payer: Self-pay | Admitting: Cardiology

## 2014-03-05 ENCOUNTER — Emergency Department (HOSPITAL_COMMUNITY)
Admission: EM | Admit: 2014-03-05 | Discharge: 2014-03-06 | Disposition: A | Discharge status: Home / Self Care | Payer: Medicaid Other | Attending: Emergency Medicine | Admitting: Emergency Medicine

## 2014-03-05 ENCOUNTER — Emergency Department (HOSPITAL_COMMUNITY): Payer: Medicaid Other

## 2014-03-05 DIAGNOSIS — Z8669 Personal history of other diseases of the nervous system and sense organs: Secondary | ICD-10-CM | POA: Diagnosis not present

## 2014-03-05 DIAGNOSIS — Z720 Tobacco use: Secondary | ICD-10-CM | POA: Diagnosis not present

## 2014-03-05 DIAGNOSIS — R1084 Generalized abdominal pain: Secondary | ICD-10-CM | POA: Diagnosis present

## 2014-03-05 DIAGNOSIS — M1990 Unspecified osteoarthritis, unspecified site: Secondary | ICD-10-CM | POA: Diagnosis not present

## 2014-03-05 DIAGNOSIS — I10 Essential (primary) hypertension: Secondary | ICD-10-CM | POA: Insufficient documentation

## 2014-03-05 DIAGNOSIS — Z79899 Other long term (current) drug therapy: Secondary | ICD-10-CM | POA: Diagnosis not present

## 2014-03-05 DIAGNOSIS — R109 Unspecified abdominal pain: Secondary | ICD-10-CM

## 2014-03-05 DIAGNOSIS — R000 Tachycardia, unspecified: Secondary | ICD-10-CM | POA: Diagnosis not present

## 2014-03-05 DIAGNOSIS — Z3202 Encounter for pregnancy test, result negative: Secondary | ICD-10-CM | POA: Diagnosis not present

## 2014-03-05 DIAGNOSIS — K5900 Constipation, unspecified: Secondary | ICD-10-CM | POA: Diagnosis not present

## 2014-03-05 DIAGNOSIS — Z791 Long term (current) use of non-steroidal anti-inflammatories (NSAID): Secondary | ICD-10-CM | POA: Diagnosis not present

## 2014-03-05 LAB — COMPREHENSIVE METABOLIC PANEL
ALK PHOS: 55 U/L (ref 39–117)
ALT: 20 U/L (ref 0–35)
AST: 28 U/L (ref 0–37)
Albumin: 3.8 g/dL (ref 3.5–5.2)
Anion gap: 9 (ref 5–15)
BUN: 10 mg/dL (ref 6–23)
CALCIUM: 9 mg/dL (ref 8.4–10.5)
CHLORIDE: 107 mmol/L (ref 96–112)
CO2: 22 mmol/L (ref 19–32)
Creatinine, Ser: 1.23 mg/dL — ABNORMAL HIGH (ref 0.50–1.10)
GFR, EST AFRICAN AMERICAN: 60 mL/min — AB (ref >90)
GFR, EST NON AFRICAN AMERICAN: 52 mL/min — AB (ref >90)
GLUCOSE: 109 mg/dL — AB (ref 70–99)
Potassium: 4 mmol/L (ref 3.5–5.1)
SODIUM: 138 mmol/L (ref 135–145)
Total Bilirubin: 0.4 mg/dL (ref 0.3–1.2)
Total Protein: 6.8 g/dL (ref 6.0–8.3)

## 2014-03-05 LAB — CBC WITH DIFFERENTIAL/PLATELET
BASOS ABS: 0 10*3/uL (ref 0.0–0.1)
Basophils Relative: 0 % (ref 0–1)
EOS ABS: 0.1 10*3/uL (ref 0.0–0.7)
EOS PCT: 1 % (ref 0–5)
HEMATOCRIT: 38.3 % (ref 36.0–46.0)
HEMOGLOBIN: 12.6 g/dL (ref 12.0–15.0)
Lymphocytes Relative: 35 % (ref 12–46)
Lymphs Abs: 2.5 10*3/uL (ref 0.7–4.0)
MCH: 30.8 pg (ref 26.0–34.0)
MCHC: 32.9 g/dL (ref 30.0–36.0)
MCV: 93.6 fL (ref 78.0–100.0)
Monocytes Absolute: 0.6 10*3/uL (ref 0.1–1.0)
Monocytes Relative: 9 % (ref 3–12)
NEUTROS ABS: 3.8 10*3/uL (ref 1.7–7.7)
NEUTROS PCT: 55 % (ref 43–77)
Platelets: 322 10*3/uL (ref 150–400)
RBC: 4.09 MIL/uL (ref 3.87–5.11)
RDW: 15.2 % (ref 11.5–15.5)
WBC: 7 10*3/uL (ref 4.0–10.5)

## 2014-03-05 MED ORDER — POLYETHYLENE GLYCOL 3350 17 GM/SCOOP PO POWD: 17.0000 g | Freq: Two times a day (BID) | ORAL | Status: AC

## 2014-03-05 MED ORDER — SODIUM CHLORIDE 0.9 % IV BOLUS (SEPSIS)
1000.0000 mL | Freq: Once | INTRAVENOUS | Status: AC
  Administered 2014-03-05: 1000 mL via INTRAVENOUS

## 2014-03-05 MED ORDER — DOCUSATE SODIUM 100 MG PO CAPS: 100.0000 mg | ORAL_CAPSULE | Freq: Two times a day (BID) | ORAL | Status: AC

## 2014-03-05 MED ORDER — DICYCLOMINE HCL 20 MG PO TABS: 20.0000 mg | ORAL_TABLET | Freq: Two times a day (BID) | ORAL | Status: AC

## 2014-03-05 MED ORDER — DICYCLOMINE HCL 10 MG PO CAPS
10.0000 mg | ORAL_CAPSULE | Freq: Once | ORAL | Status: AC
  Administered 2014-03-05: 10 mg via ORAL
  Filled 2014-03-05: qty 1

## 2014-03-05 MED ORDER — MORPHINE SULFATE 4 MG/ML IJ SOLN
4.0000 mg | Freq: Once | INTRAMUSCULAR | Status: AC
Start: 2014-03-05 — End: 2014-03-05
  Administered 2014-03-05: 4 mg via INTRAVENOUS
  Filled 2014-03-05: qty 1

## 2014-03-05 NOTE — ED Provider Notes (Signed)
CSN: [id number]     Arrival date & time 03/05/14  1813 History   First MD Initiated Contact with Patient 03/05/14 2002     Chief Complaint  Patient presents with  . Abdominal Pain  . Back Pain     (Consider location/radiation/quality/duration/timing/severity/associated sxs/prior Treatment) HPI Comments: Patient is a 44 yo F PMHx significant for HTN, tobacco abuse presenting to the emergency department for 2 months of generalized abdominal pain is present with radiation to her back. She describes her pain as cramping fullness, states in the last month she has felt increasingly bloated. She states she has had associated constipation, endorsing passing small hard bowel movements with positive flatus. She's tried one laxative with little to no improvement. No modifying factors identified. No other medications tried prior to arrival. Abdominal surgical history includes cesarean section.  Patient is a 44 y.o. female presenting with abdominal pain. The history is provided by the patient.  Abdominal Pain Pain location:  Generalized Pain quality: bloating, cramping, fullness and pressure   Pain radiates to:  Back Pain severity:  Mild Onset quality:  Gradual Timing:  Constant Progression:  Worsening Chronicity:  New Context: laxative use and previous surgery   Context: not alcohol use, not awakening from sleep, not diet changes, not eating, not medication withdrawal, not recent illness, not recent sexual activity, not recent travel, not retching, not sick contacts, not suspicious food intake and not trauma   Relieved by:  Nothing Worsened by:  Nothing tried Ineffective treatments: laxative x 1 use. Associated symptoms: constipation and flatus   Associated symptoms: no belching, no chest pain, no chills, no cough, no diarrhea, no dysuria, no fatigue, no fever, no hematemesis, no hematochezia, no hematuria, no melena, no nausea, no shortness of breath, no sore throat, no vaginal bleeding, no vaginal  discharge and no vomiting   Risk factors: obesity   Risk factors: no alcohol abuse     Past Medical History  Diagnosis Date  . Reflux   . Hypertension   . Carpal tunnel syndrome   . DJD (degenerative joint disease)    Past Surgical History  Procedure Laterality Date  . Cesarean section     History reviewed. No pertinent family history. History  Substance Use Topics  . Smoking status: Current Every Day Smoker -- 1.00 packs/day  . Smokeless tobacco: Not on file  . Alcohol Use: Yes     Comment: occ   OB History    No data available     Review of Systems  Constitutional: Negative for fever, chills and fatigue.  HENT: Negative for sore throat.   Respiratory: Negative for cough and shortness of breath.   Cardiovascular: Negative for chest pain.  Gastrointestinal: Positive for abdominal pain, constipation and flatus. Negative for nausea, vomiting, diarrhea, melena, hematochezia and hematemesis.  Genitourinary: Negative for dysuria, hematuria, vaginal bleeding and vaginal discharge.  All other systems reviewed and are negative.     Allergies  Levaquin and Naproxen  Home Medications   Prior to Admission medications   Medication Sig Start Date End Date Taking? Authorizing Provider  cloNIDine (CATAPRES) 0.1 MG tablet Take 1 tablet (0.1 mg total) by mouth 2 (two) times daily. 10/20/13  Yes Ozella Rocks, MD  cyclobenzaprine (FLEXERIL) 10 MG tablet Take 10 mg by mouth 3 (three) times daily as needed for muscle spasms.   Yes Historical Provider, MD  diclofenac sodium (VOLTAREN) 1 % GEL Apply 4 application topically 4 (four) times daily. 10/20/13  Yes Ozella Rocks,  MD  gabapentin (NEURONTIN) 300 MG capsule Take 300 mg by mouth 3 (three) times daily.   Yes Historical Provider, MD  ibuprofen (ADVIL,MOTRIN) 600 MG tablet Take 600 mg by mouth every 6 (six) hours as needed for moderate pain.   Yes Historical Provider, MD  metoprolol tartrate (LOPRESSOR) 25 MG tablet Take 25 mg by  mouth 2 (two) times daily.   Yes Historical Provider, MD  omeprazole (PRILOSEC) 40 MG capsule Take 1 capsule (40 mg total) by mouth daily. 10/20/13  Yes Ozella Rocks, MD  dicyclomine (BENTYL) 20 MG tablet Take 1 tablet (20 mg total) by mouth 2 (two) times daily. 03/05/14   Chassie Pennix L Antoney Biven, PA-C  docusate sodium (COLACE) 100 MG capsule Take 1 capsule (100 mg total) by mouth every 12 (twelve) hours. 03/05/14   Erven Ramson L Jerlisa Diliberto, PA-C  HYDROcodone-acetaminophen (NORCO/VICODIN) 5-325 MG per tablet Take 2 tablets by mouth every 6 (six) hours as needed. Patient not taking: Reported on 03/05/2014 12/12/13   Arman Filter, NP  metaxalone (SKELAXIN) 800 MG tablet Take 0.5-1 tablets (400-800 mg total) by mouth 3 (three) times daily. Patient not taking: Reported on 03/05/2014 10/20/13   Ozella Rocks, MD  polyethylene glycol powder (GLYCOLAX/MIRALAX) powder Take 17 g by mouth 2 (two) times daily. Until daily soft stools  OTC 03/05/14   Ailish Prospero L Chiante Peden, PA-C  predniSONE (DELTASONE) 20 MG tablet 3 Tabs PO Days 1-3, then 2 tabs PO Days 4-6, then 1 tab PO Day 7-9, then Half Tab PO Day 10-12 Patient not taking: Reported on 03/05/2014 12/12/13   Arman Filter, NP   BP 113/59 mmHg  Pulse 99  Temp(Src) 97.9 F (36.6 C) (Oral)  Resp 16  Ht  (1.727 m)  Wt 245 lb (111.131 kg)  BMI 37.26 kg/m2  SpO2 96%  LMP 03/03/2014 Physical Exam  Constitutional: She is oriented to person, place, and time. She appears well-developed and well-nourished.  HENT:  Head: Normocephalic and atraumatic.  Right Ear: External ear normal.  Left Ear: External ear normal.  Nose: Nose normal.  Mouth/Throat: Oropharynx is clear and moist.  Eyes: Conjunctivae are normal.  Neck: Neck supple.  Cardiovascular: Regular rhythm and normal heart sounds.  Tachycardia present.   Pulmonary/Chest: Effort normal and breath sounds normal. No respiratory distress.  Abdominal: Soft. Bowel sounds are normal. She exhibits  distension. There is generalized tenderness (Mild). There is no rigidity, no rebound, no guarding and no CVA tenderness.  Musculoskeletal: Normal range of motion. She exhibits no edema.  Neurological: She is alert and oriented to person, place, and time.  Skin: Skin is warm and dry.  Nursing note and vitals reviewed.   ED Course  Procedures (including critical care time) Medications  sodium chloride 0.9 % bolus 1,000 mL (0 mLs Intravenous Stopped 03/05/14 2342)  dicyclomine (BENTYL) capsule 10 mg (10 mg Oral Given 03/05/14 2204)  morphine 4 MG/ML injection 4 mg (4 mg Intravenous Given 03/05/14 2333)    Labs Review Labs Reviewed  COMPREHENSIVE METABOLIC PANEL - Abnormal; Notable for the following:    Glucose, Bld 109 (*)    Creatinine, Ser 1.23 (*)    GFR calc non Af Amer 52 (*)    GFR calc Af Amer 60 (*)    All other components within normal limits  URINALYSIS, ROUTINE W REFLEX MICROSCOPIC - Abnormal; Notable for the following:    Hgb urine dipstick MODERATE (*)    Leukocytes, UA SMALL (*)    All other components  within normal limits  URINE CULTURE  CBC WITH DIFFERENTIAL/PLATELET  PREGNANCY, URINE  URINE MICROSCOPIC-ADD ON    Imaging Review Dg Abd Portable 1v  03/05/2014   CLINICAL DATA:  Low abdominal and back pain for a few months. Abdominal pain. Chronic back pain. No known injury.  EXAM: PORTABLE ABDOMEN - 1 VIEW  COMPARISON:  CT lumbar spine 12/12/2013  FINDINGS: The bowel gas pattern is normal. No radio-opaque calculi or other significant radiographic abnormality are seen.  IMPRESSION: Negative.   Electronically Signed   By: Burman NievesWilliam  Stevens M.D.   On: 03/05/2014 21:04     EKG Interpretation None      MDM   Final diagnoses:  Abdominal pain in female  Constipation, unspecified constipation type    Filed Vitals:   03/06/14 0119  BP: 113/59  Pulse: 99  Temp:   Resp: 16   Afebrile, NAD, non-toxic appearing, AAOx4.  Patient is nontoxic, nonseptic appearing, in  no apparent distress.  Patient's pain and other symptoms adequately managed in emergency department.  Fluid bolus given. Tachycardia improved with pain management and IV hydration. Labs, imaging and vitals reviewed.  Patient does not meet the SIRS or Sepsis criteria.  On repeat exam patient does not have a surgical abdomin and there are no peritoneal signs.  No indication of appendicitis, bowel obstruction, bowel perforation, cholecystitis, diverticulitis, PID or ectopic pregnancy.  Stool burden noted on my review of AXR. Patient discharged home with symptomatic treatment and given strict instructions for follow-up with their primary care physician.  I have also discussed reasons to return immediately to the ER.  Patient expresses understanding and agrees with plan. Patient is stable at time of discharge        Jeannetta EllisJennifer L Shayanne Gomm, PA-C 03/06/14 1607  Linwood DibblesJon Knapp, MD 03/09/14 [phone]

## 2014-03-05 NOTE — ED Notes (Signed)
Pt prompted for urine.

## 2014-03-05 NOTE — ED Notes (Signed)
Patient getting ride from friend

## 2014-03-05 NOTE — Discharge Instructions (Signed)
Please follow up with your primary care physician in 1-2 days. If you do not have one please call the [facility] number listed above. Please use medications as prescribed. Please read all discharge instructions and return precautions.    Abdominal Pain Many things can cause abdominal pain. Usually, abdominal pain is not caused by a disease and will improve without treatment. It can often be observed and treated at home. Your health care provider will do a physical exam and possibly order blood tests and X-rays to help determine the seriousness of your pain. However, in many cases, more time must pass before a clear cause of the pain can be found. Before that point, your health care provider may not know if you need more testing or further treatment. HOME CARE INSTRUCTIONS  Monitor your abdominal pain for any changes. The following actions may help to alleviate any discomfort you are experiencing:  Only take over-the-counter or prescription medicines as directed by your health care provider.  Do not take laxatives unless directed to do so by your health care provider.  Try a clear liquid diet (broth, tea, or water) as directed by your health care provider. Slowly move to a bland diet as tolerated. SEEK MEDICAL CARE IF:  You have unexplained abdominal pain.  You have abdominal pain associated with nausea or diarrhea.  You have pain when you urinate or have a bowel movement.  You experience abdominal pain that wakes you in the night.  You have abdominal pain that is worsened or improved by eating food.  You have abdominal pain that is worsened with eating fatty foods.  You have a fever. SEEK IMMEDIATE MEDICAL CARE IF:   Your pain does not go away within 2 hours.  You keep throwing up (vomiting).  Your pain is felt only in portions of the abdomen, such as the right side or the left lower portion of the abdomen.  You pass bloody or black tarry stools. MAKE SURE  YOU:  Understand these instructions.   Will watch your condition.   Will get help right away if you are not doing well or get worse.  Document Released: 10/31/2004 Document Revised: 01/26/2013 Document Reviewed: 09/30/2012 [facility] Patient Information 2015 [place], [place]. This information is not intended to replace advice given to you by your health care provider. Make sure you discuss any questions you have with your health care provider. Constipation Constipation is when a person has fewer than three bowel movements a week, has difficulty having a bowel movement, or has stools that are dry, hard, or larger than normal. As people grow older, constipation is more common. If you try to fix constipation with medicines that make you have a bowel movement (laxatives), the problem may get worse. Long-term laxative use may cause the muscles of the colon to become weak. A low-fiber diet, not taking in enough fluids, and taking certain medicines may make constipation worse.  CAUSES   Certain medicines, such as antidepressants, pain medicine, iron supplements, antacids, and water pills.   Certain diseases, such as diabetes, irritable bowel syndrome (IBS), thyroid disease, or depression.   Not drinking enough water.   Not eating enough fiber-rich foods.   Stress or travel.   Lack of physical activity or exercise.   Ignoring the urge to have a bowel movement.   Using laxatives too much.  SIGNS AND SYMPTOMS   Having fewer than three bowel movements a week.   Straining to have a bowel movement.  Having stools that are hard, dry, or larger than normal.   Feeling full or bloated.   Pain in the lower abdomen.   Not feeling relief after having a bowel movement.  DIAGNOSIS  Your health care provider will take a medical history and perform a physical exam. Further testing may be done for severe constipation. Some tests may include:  A barium enema X-ray to examine your  rectum, colon, and, sometimes, your small intestine.   A sigmoidoscopy to examine your lower colon.   A colonoscopy to examine your entire colon. TREATMENT  Treatment will depend on the severity of your constipation and what is causing it. Some dietary treatments include drinking more fluids and eating more fiber-rich foods. Lifestyle treatments may include regular exercise. If these diet and lifestyle recommendations do not help, your health care provider may recommend taking over-the-counter laxative medicines to help you have bowel movements. Prescription medicines may be prescribed if over-the-counter medicines do not work.  HOME CARE INSTRUCTIONS   Eat foods that have a lot of fiber, such as fruits, vegetables, whole grains, and beans.  Limit foods high in fat and processed sugars, such as french fries, hamburgers, cookies, candies, and soda.   A fiber supplement may be added to your diet if you cannot get enough fiber from foods.   Drink enough fluids to keep your urine clear or pale yellow.   Exercise regularly or as directed by your health care provider.   Go to the restroom when you have the urge to go. Do not hold it.   Only take over-the-counter or prescription medicines as directed by your health care provider. Do not take other medicines for constipation without talking to your health care provider first.  SEEK IMMEDIATE MEDICAL CARE IF:   You have bright red blood in your stool.   Your constipation lasts for more than 4 days or gets worse.   You have abdominal or rectal pain.   You have thin, pencil-like stools.   You have unexplained weight loss. MAKE SURE YOU:   Understand these instructions.  Will watch your condition.  Will get help right away if you are not doing well or get worse. Document Released: 10/20/2003 Document Revised: 01/26/2013 Document Reviewed: 11/02/2012 [facility] Patient Information 2015 [place]. This information is not  intended to replace advice given to you by your health care provider. Make sure you discuss any questions you have with your health care provider.

## 2014-03-05 NOTE — ED Notes (Signed)
Pt reports that she has had lower back pain and abd pain for the past month. Denies any n/v or urinary symptoms.

## 2014-03-06 LAB — URINALYSIS, ROUTINE W REFLEX MICROSCOPIC
Bilirubin Urine: NEGATIVE
GLUCOSE, UA: NEGATIVE mg/dL
KETONES UR: NEGATIVE mg/dL
Nitrite: NEGATIVE
PROTEIN: NEGATIVE mg/dL
Specific Gravity, Urine: 1.021 (ref 1.005–1.030)
Urobilinogen, UA: 0.2 mg/dL (ref 0.0–1.0)
pH: 5.5 (ref 5.0–8.0)

## 2014-03-06 LAB — PREGNANCY, URINE: Preg Test, Ur: NEGATIVE

## 2014-03-06 LAB — URINE MICROSCOPIC-ADD ON

## 2014-03-07 LAB — URINE CULTURE: Colony Count: 75000

## 2015-10-18 IMAGING — CT CT HEAD W/O CM
1 series · 16 of 30 positions shown, 20 images · non-contrast
Comparison: None.

CLINICAL DATA: Bilateral leg weakness and numbness, progressively
worsening over 2 weeks.

EXAM:
CT HEAD WITHOUT CONTRAST
TECHNIQUE: Contiguous axial images were obtained from the base of the skull
through the vertex without intravenous contrast.

[Series 2: head 5.0 h30s · axial · 0.46mm/px · z∈[-93,+42]mm · 16 of 30 slices shown, 20 images]
[im 2/30  brain]
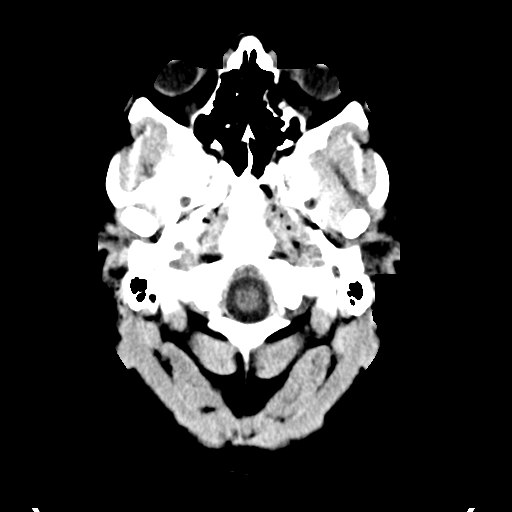
[im 2/30  bone]
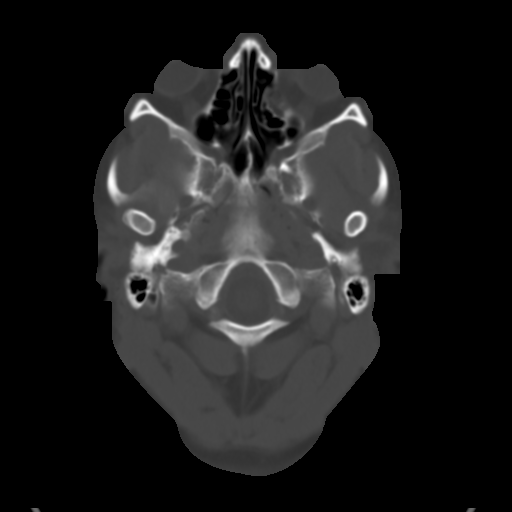
[im 4/30  brain]
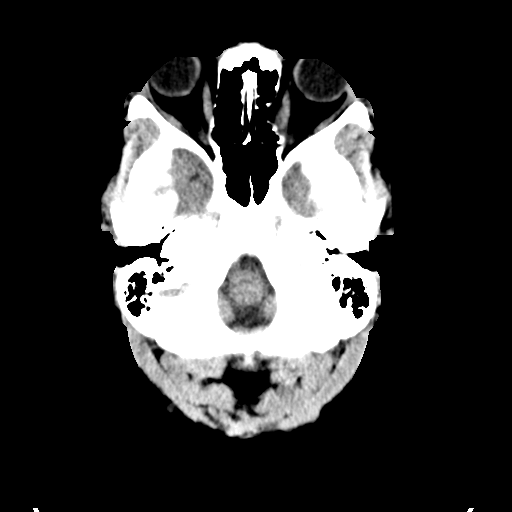
[im 6/30  brain]
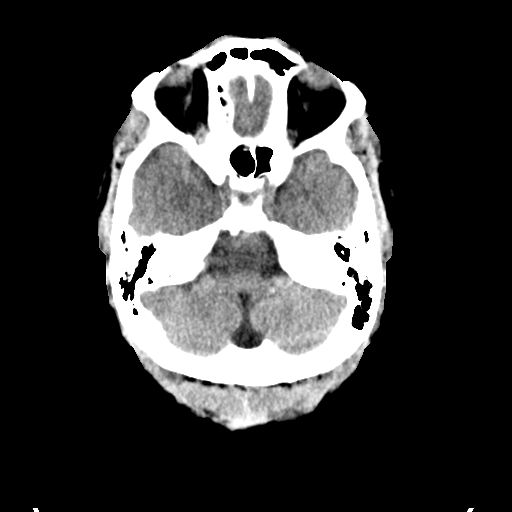
[im 8/30  brain]
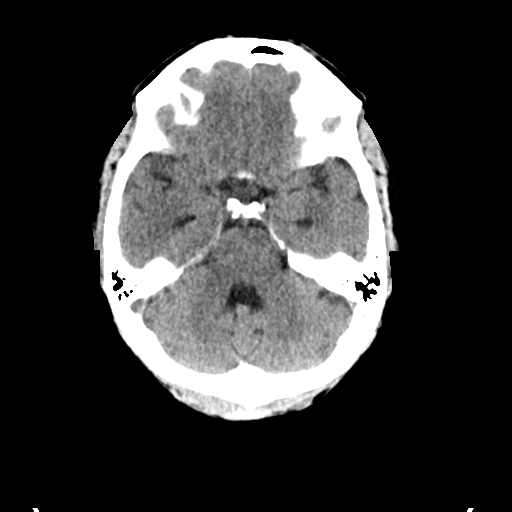
[im 9/30  brain]
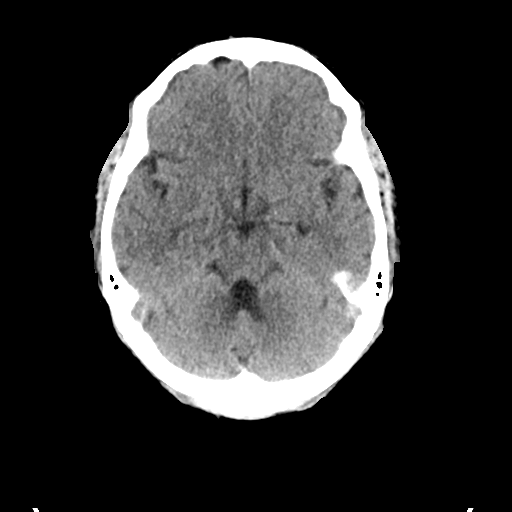
[im 9/30  bone]
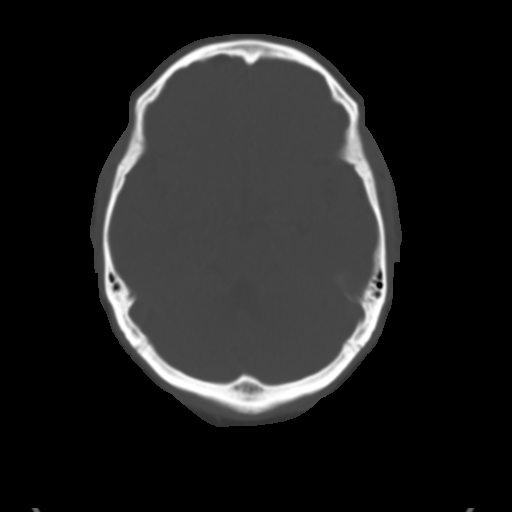
[im 11/30  brain]
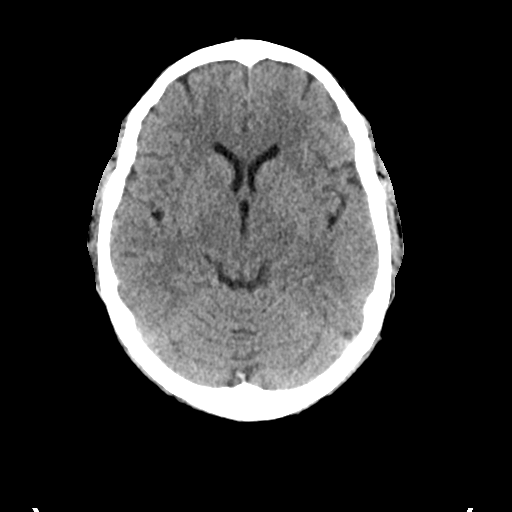
[im 13/30  brain]
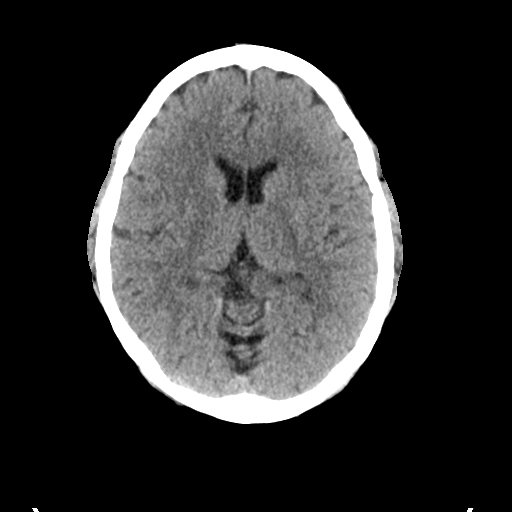
[im 15/30  brain]
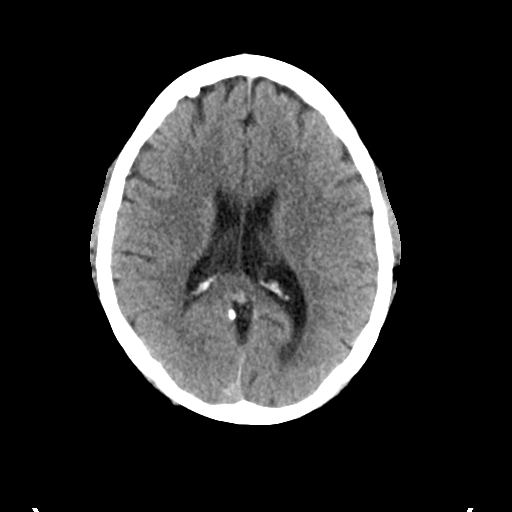
[im 16/30  brain]
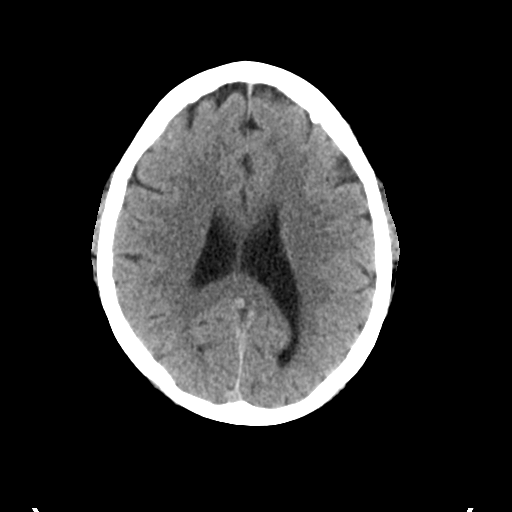
[im 16/30  bone]
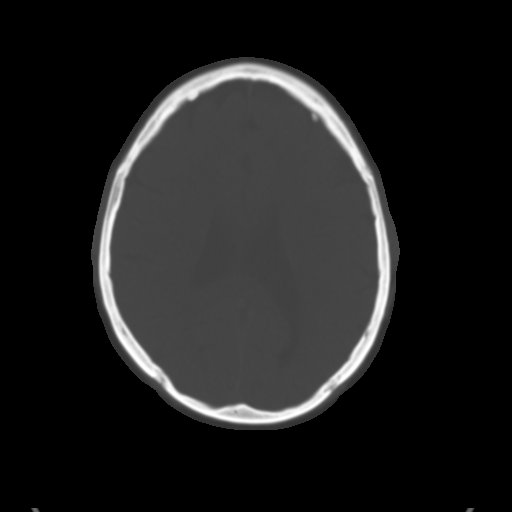
[im 18/30  brain]
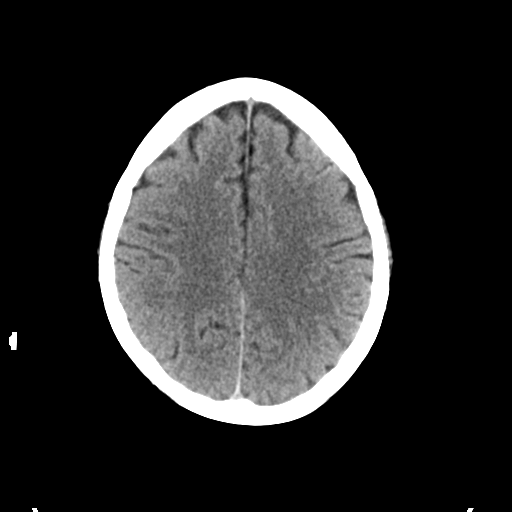
[im 20/30  brain]
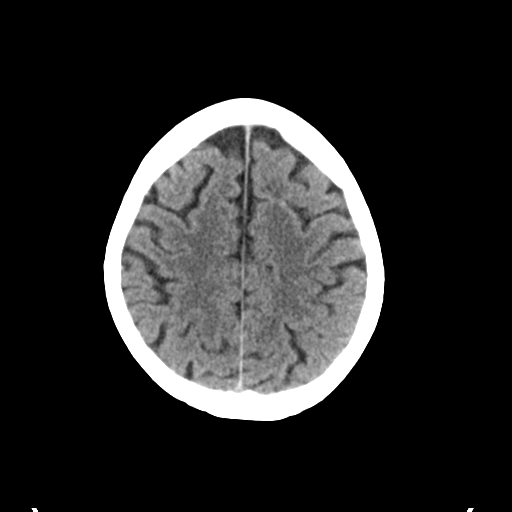
[im 22/30  brain]
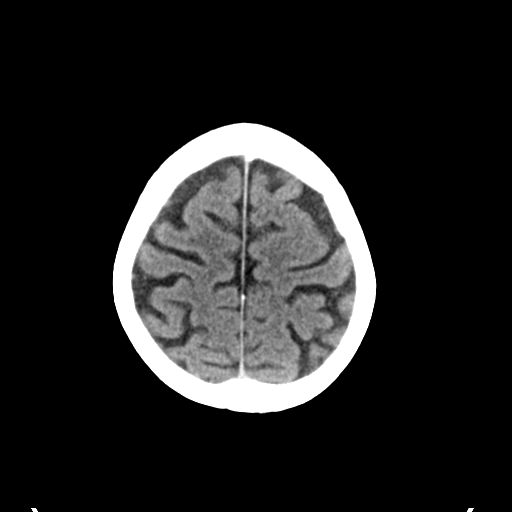
[im 23/30  brain]
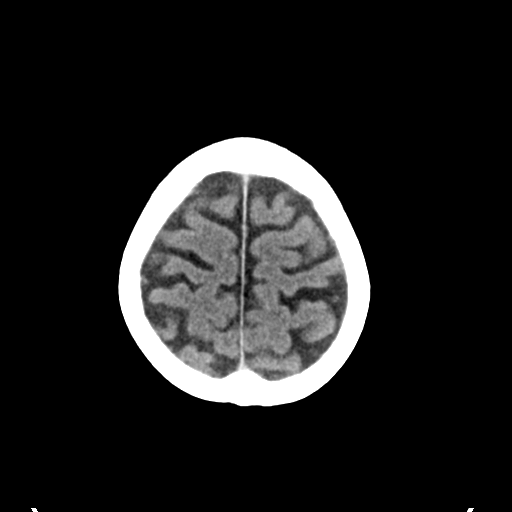
[im 23/30  bone]
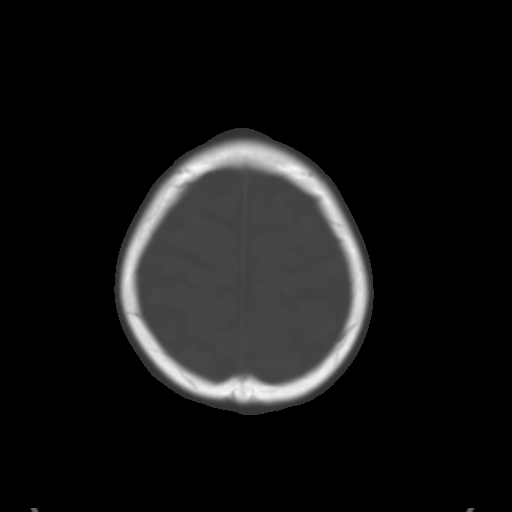
[im 25/30  brain]
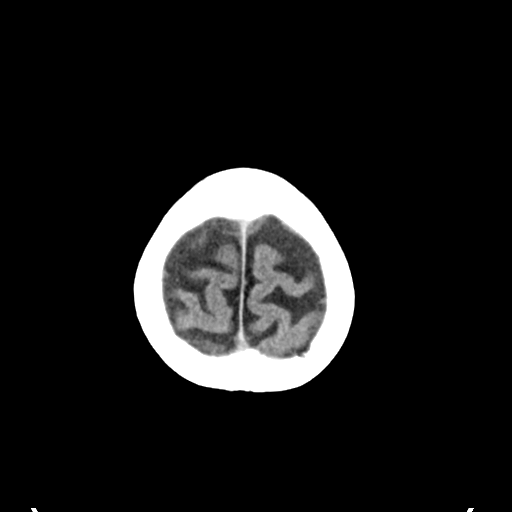
[im 27/30  brain]
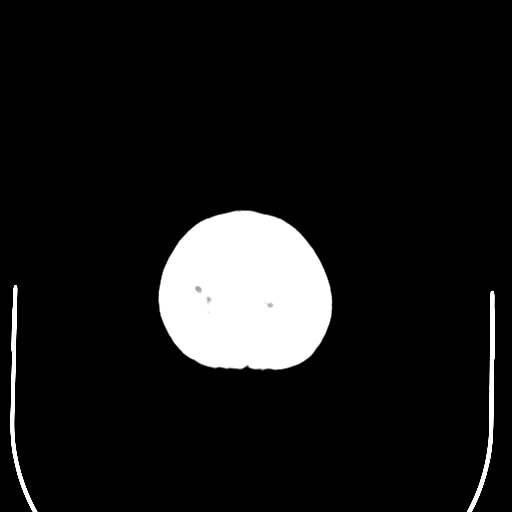
[im 29/30  brain]
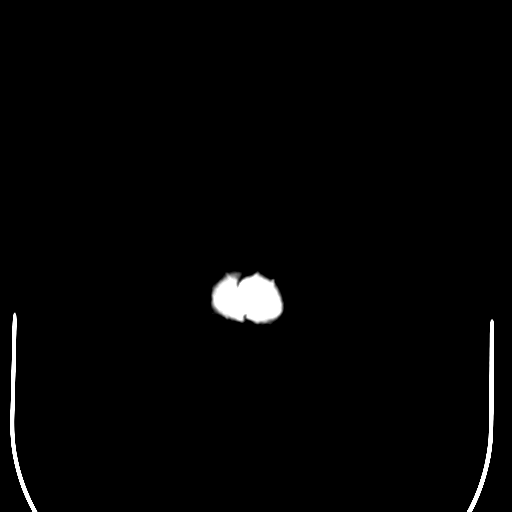

[16 of 30 positions shown; findings below may reference images not displayed]

FINDINGS: Ventricles and sulci appear symmetrical. No mass effect or midline
shift. No abnormal extra-axial fluid collections. Gray-white matter
junctions are distinct. Basal cisterns are not effaced. No evidence
of acute intracranial hemorrhage. No depressed skull fractures. Old
fracture deformity of the left medial orbital wall. Paranasal
sinuses and mastoid air cells as visualized are not opacified.
IMPRESSION: No acute intracranial abnormalities.

## 2016-01-30 IMAGING — CR DG ABD PORTABLE 1V
2 series · 2 of 2 positions shown · non-contrast
Comparison: CT lumbar spine 12/12/2013

CLINICAL DATA: Low abdominal and back pain for a few months.
Abdominal pain. Chronic back pain. No known injury.

EXAM:
PORTABLE ABDOMEN - 1 VIEW

[AP (1 of 2)]
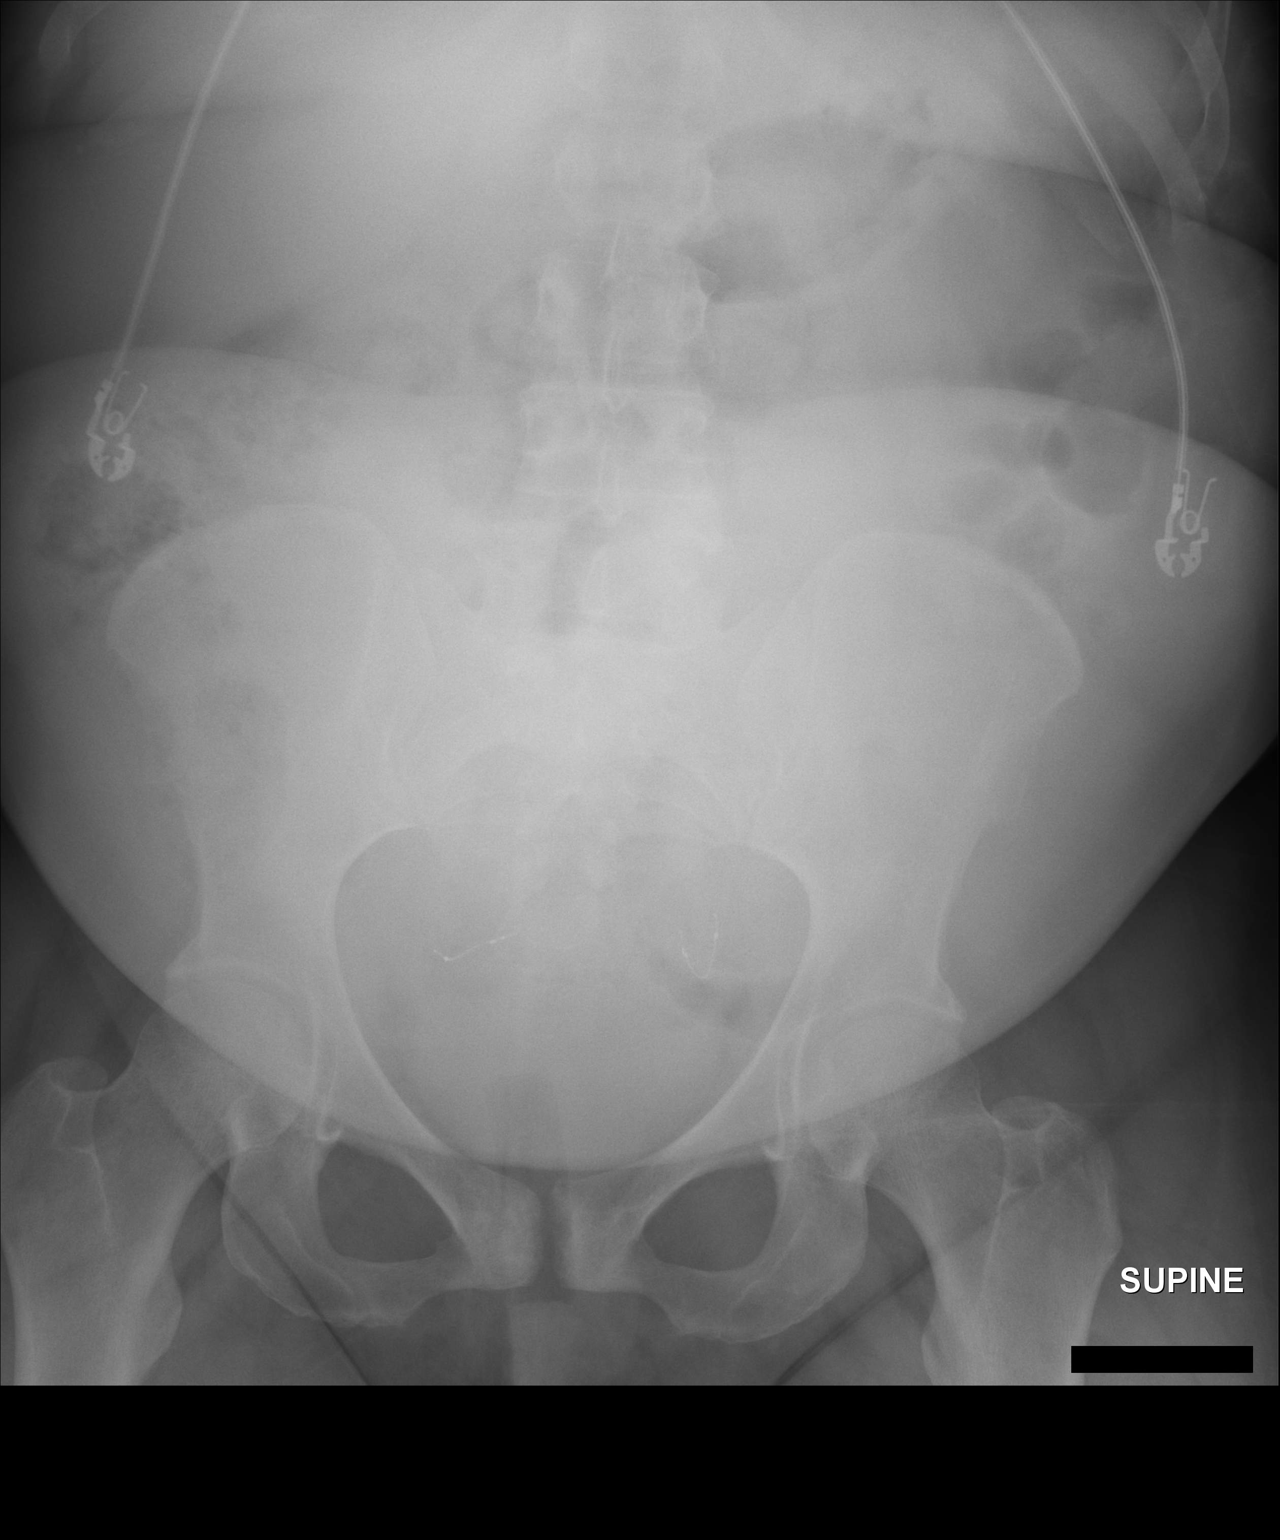

[AP (2 of 2)]
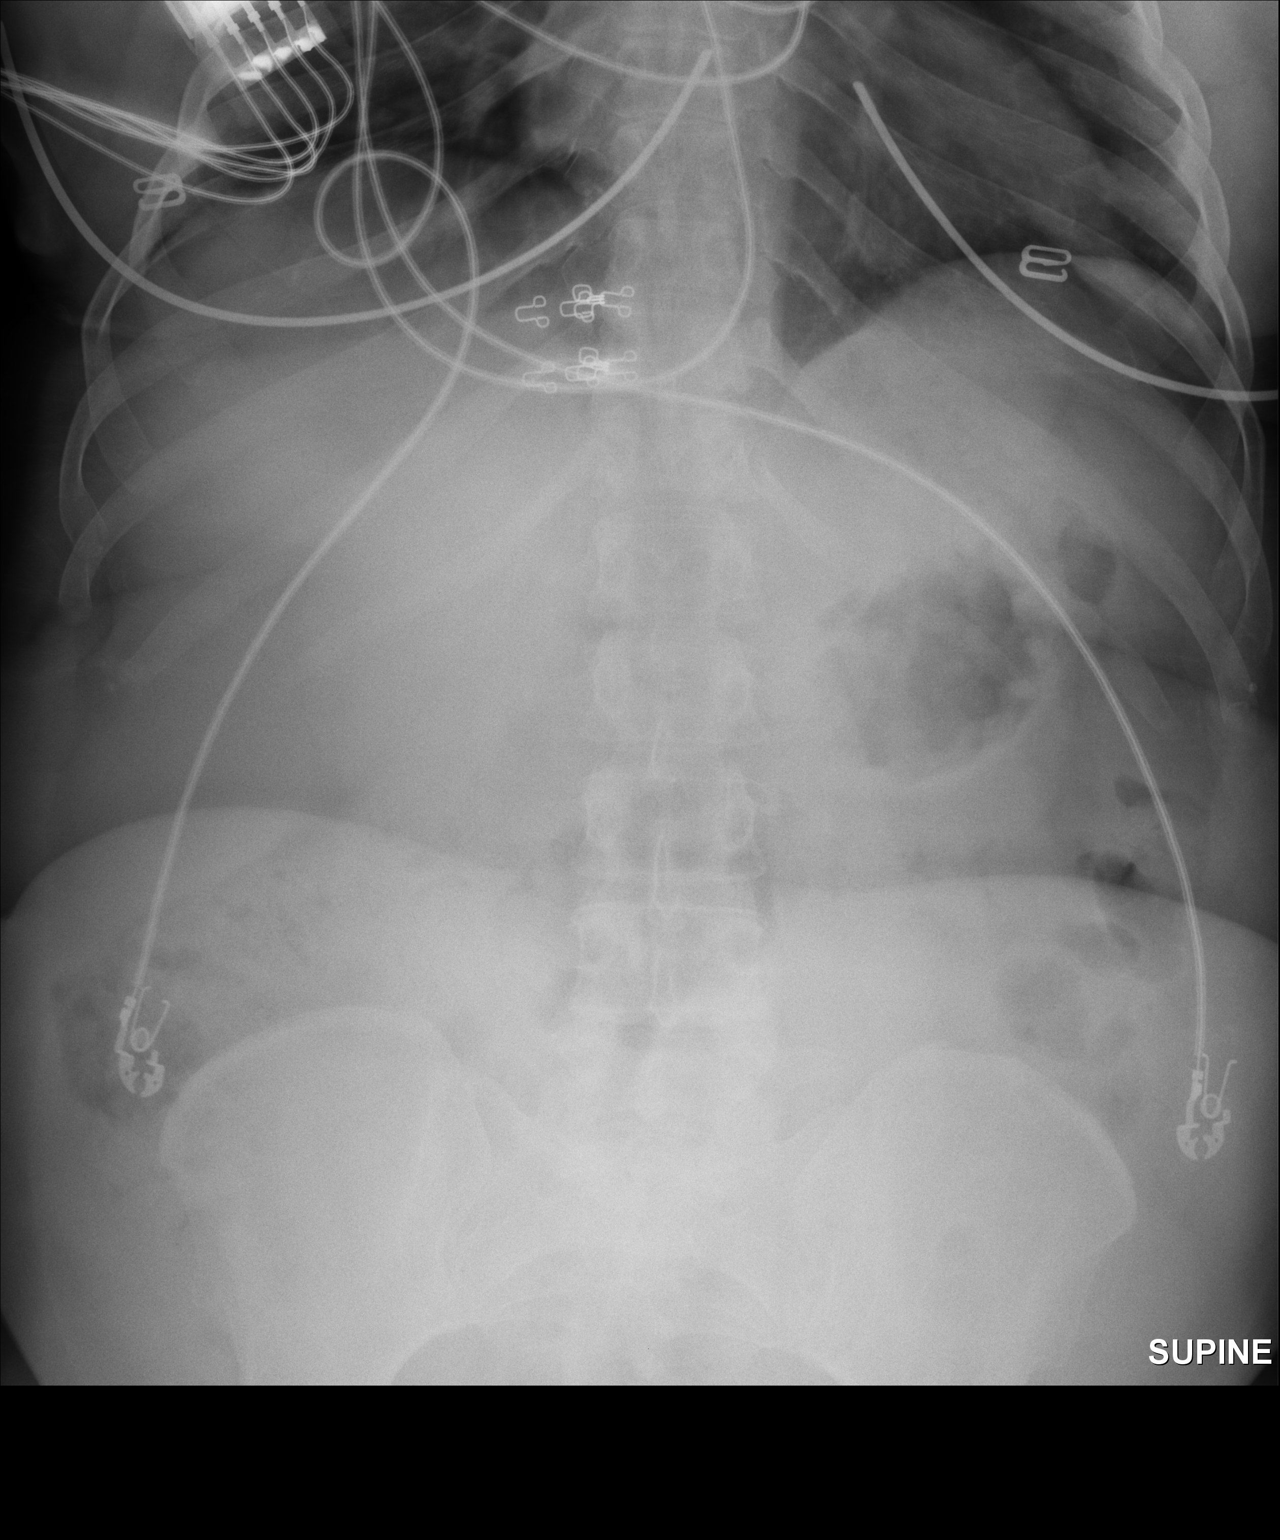

[2 of 2 positions shown; findings below may reference images not displayed]

FINDINGS: The bowel gas pattern is normal. No radio-opaque calculi or other
significant radiographic abnormality are seen.
IMPRESSION: Negative.
